# Patient Record
Sex: Male | Born: 1975 | Race: Black or African American | Hispanic: No | Marital: Single | Smoking: Current every day smoker
Health system: Southern US, Community
[De-identification: ages and names within clinical notes are randomized; demographics above are authoritative.]

## PROBLEM LIST (undated history)

## (undated) DIAGNOSIS — IMO0001 Reserved for inherently not codable concepts without codable children: Secondary | ICD-10-CM

---

## 2015-01-13 ENCOUNTER — Inpatient Hospital Stay (HOSPITAL_BASED_OUTPATIENT_CLINIC_OR_DEPARTMENT_OTHER)
Admission: EM | Admit: 2015-01-13 | Discharge: 2015-01-17 | DRG: 565 | Disposition: A | Discharge status: Home / Self Care | Payer: Self-pay | Attending: Internal Medicine | Admitting: Internal Medicine

## 2015-01-13 ENCOUNTER — Emergency Department (HOSPITAL_BASED_OUTPATIENT_CLINIC_OR_DEPARTMENT_OTHER): Payer: Self-pay

## 2015-01-13 DIAGNOSIS — F1410 Cocaine abuse, uncomplicated: Secondary | ICD-10-CM | POA: Diagnosis present

## 2015-01-13 DIAGNOSIS — F17210 Nicotine dependence, cigarettes, uncomplicated: Secondary | ICD-10-CM | POA: Diagnosis present

## 2015-01-13 DIAGNOSIS — R079 Chest pain, unspecified: Secondary | ICD-10-CM

## 2015-01-13 DIAGNOSIS — R0789 Other chest pain: Secondary | ICD-10-CM | POA: Diagnosis present

## 2015-01-13 DIAGNOSIS — F1210 Cannabis abuse, uncomplicated: Secondary | ICD-10-CM | POA: Diagnosis present

## 2015-01-13 DIAGNOSIS — L02611 Cutaneous abscess of right foot: Secondary | ICD-10-CM | POA: Diagnosis present

## 2015-01-13 DIAGNOSIS — M795 Residual foreign body in soft tissue: Principal | ICD-10-CM | POA: Diagnosis present

## 2015-01-13 DIAGNOSIS — L03115 Cellulitis of right lower limb: Secondary | ICD-10-CM

## 2015-01-13 DIAGNOSIS — B9562 Methicillin resistant Staphylococcus aureus infection as the cause of diseases classified elsewhere: Secondary | ICD-10-CM | POA: Diagnosis present

## 2015-01-13 DIAGNOSIS — F1910 Other psychoactive substance abuse, uncomplicated: Secondary | ICD-10-CM | POA: Diagnosis present

## 2015-01-13 HISTORY — DX: Reserved for inherently not codable concepts without codable children: IMO0001

## 2015-01-13 LAB — BASIC METABOLIC PANEL
ANION GAP: 7 (ref 5–15)
BUN: 15 mg/dL (ref 6–20)
CALCIUM: 8.6 mg/dL — AB (ref 8.9–10.3)
CO2: 26 mmol/L (ref 22–32)
CREATININE: 1.18 mg/dL (ref 0.61–1.24)
Chloride: 104 mmol/L (ref 101–111)
GFR calc Af Amer: >60 mL/min (ref >60)
GLUCOSE: 105 mg/dL — AB (ref 65–99)
Potassium: 3.5 mmol/L (ref 3.5–5.1)
Sodium: 137 mmol/L (ref 135–145)

## 2015-01-13 LAB — CBC WITH DIFFERENTIAL/PLATELET
BASOS ABS: 0 10*3/uL (ref 0.0–0.1)
BASOS PCT: 0 %
EOS PCT: 1 %
Eosinophils Absolute: 0.1 10*3/uL (ref 0.0–0.7)
HCT: 43.7 % (ref 39.0–52.0)
Hemoglobin: 14 g/dL (ref 13.0–17.0)
LYMPHS PCT: 21 %
Lymphs Abs: 2.1 10*3/uL (ref 0.7–4.0)
MCH: 28.8 pg (ref 26.0–34.0)
MCHC: 32 g/dL (ref 30.0–36.0)
MCV: 89.9 fL (ref 78.0–100.0)
MONO ABS: 1 10*3/uL (ref 0.1–1.0)
MONOS PCT: 10 %
Neutro Abs: 6.6 10*3/uL (ref 1.7–7.7)
Neutrophils Relative %: 68 %
PLATELETS: 165 10*3/uL (ref 150–400)
RBC: 4.86 MIL/uL (ref 4.22–5.81)
RDW: 13.4 % (ref 11.5–15.5)
WBC: 9.8 10*3/uL (ref 4.0–10.5)

## 2015-01-13 LAB — I-STAT CG4 LACTIC ACID, ED: Lactic Acid, Venous: 1.57 mmol/L (ref 0.5–2.0)

## 2015-01-13 MED ORDER — ONDANSETRON HCL 4 MG/2ML IJ SOLN
4.0000 mg | Freq: Once | INTRAMUSCULAR | Status: AC
Start: 2015-01-13 — End: 2015-01-13
  Administered 2015-01-13: 4 mg via INTRAVENOUS
  Filled 2015-01-13: qty 2

## 2015-01-13 MED ORDER — MORPHINE SULFATE (PF) 4 MG/ML IV SOLN
4.0000 mg | Freq: Once | INTRAVENOUS | Status: AC
  Administered 2015-01-13: 4 mg via INTRAVENOUS
  Filled 2015-01-13: qty 1

## 2015-01-13 MED ORDER — VANCOMYCIN HCL IN DEXTROSE 1-5 GM/200ML-% IV SOLN
1000.0000 mg | Freq: Once | INTRAVENOUS | Status: AC
  Administered 2015-01-14: 1000 mg via INTRAVENOUS
  Filled 2015-01-13: qty 200

## 2015-01-13 MED ORDER — SODIUM CHLORIDE 0.9 % IV BOLUS (SEPSIS)
1000.0000 mL | Freq: Once | INTRAVENOUS | Status: AC
  Administered 2015-01-13: 1000 mL via INTRAVENOUS

## 2015-01-13 NOTE — ED Notes (Addendum)
Patient states that he has had  "tingling" in his chest x 1 week. The patient states that he has woke up today with swelling to his right foot. Patient is rubbing his chest in pain, but is more concerned about his foot pain.

## 2015-01-13 NOTE — ED Notes (Signed)
Pt c/o cp  Sharpe tingling  X 1 week tired, sob,  Rt foot red swollen w a sore between great toe and second toe,  Onset yesterday  Denies inj

## 2015-01-13 NOTE — ED Provider Notes (Signed)
By signing my name below, I, Bethel Born, attest that this documentation has been prepared under the direction and in the presence of Marea Reasner N Ashanna Heinsohn, DO. Electronically Signed: Bethel Born, ED Scribe. 01/13/2015. 11:36 PM.  TIME SEEN: 11:24 PM  CHIEF COMPLAINT: chest pain  HPI: [name] [name] is a 39 y.o. male with no significant PMHx who presents to the Emergency Department complaining of new intermittent left-sided chest pain with onset 1 week ago. He describes the pain as like an electric shock, sharp pain with no known trigger or modifying factors. No current CP.  Associated symptoms include intermittent SOB (none at present). He also complains of 1 day of atraumatic right foot, pain, redness, and swelling with a draining area between the great and second toes. Pt denies fever, nausea,  and vomiting. Tobacco+. NKDA. Denies being a diabetic. No history of PE or DVT. No family history of CAD.  ROS: See HPI Constitutional: no fever  Eyes: no drainage  ENT: no runny nose   Cardiovascular:  chest pain  Resp: SOB  GI: no vomiting GU: no dysuria Integumentary: no rash  Allergy: no hives  Musculoskeletal: no leg swelling  Neurological: no slurred speech ROS otherwise negative  PAST MEDICAL HISTORY/PAST SURGICAL HISTORY:  No past medical history on file.  MEDICATIONS:  Prior to Admission medications   Not on File    ALLERGIES:  No Known Allergies  SOCIAL HISTORY:  Social History  Substance Use Topics  . Smoking status: Not on file  . Smokeless tobacco: Not on file  . Alcohol Use: Not on file    FAMILY HISTORY: No family history on file.  EXAM: BP 140/78 mmHg  Pulse 104  Temp(Src) 99.2 F (37.3 C) (Oral)  Resp 18  Ht  (1.702 m)  Wt 175 lb (79.379 kg)  BMI 27.40 kg/m2  SpO2 98% CONSTITUTIONAL: Alert and oriented and responds appropriately to questions. Well-appearing; well-nourished HEAD: Normocephalic EYES: Conjunctivae clear, PERRL ENT: normal nose;  no rhinorrhea; moist mucous membranes; pharynx without lesions noted NECK: Supple, no meningismus, no LAD  CARD: Regular and tachycardic; S1 and S2 appreciated; no murmurs, no clicks, no rubs, no gallops RESP: Normal chest excursion without splinting or tachypnea; breath sounds clear and equal bilaterally; no wheezes, no rhonchi, no rales, no hypoxia or respiratory distress, speaking full sentences ABD/GI: Normal bowel sounds; non-distended; soft, non-tender, no rebound, no guarding, no peritoneal signs BACK:  The back appears normal and is non-tender to palpation, there is no CVA tenderness EXT: 2+ DP pulses bilaterally normal capillary refill; no cyanosis, no calf tenderness or swelling, patient has erythema, warmth, swelling, and tenderness to the dorsal right foot and foul smelling purulent discharge bewteen the first and second toes without obvious drainable abscess, pain when moving first and second toes, normal ROM in the ankle without any obvious sign of swelling, otherwise extremity is nontender to palpation without any sign of bony deformity SKIN: Normal color for age and race; warm NEURO: Moves all extremities equally, sensation to light touch intact diffusely, cranial nerves II through XII intact PSYCH: The patient's mood and manner are appropriate. Grooming and personal hygiene are appropriate.  MEDICAL DECISION MAKING: Patient here with chest pain for the past week. EKG shows sinus tachycardia with no ischemic changes, arrhythmia. No history of PE or DVT. No history of CAD. Also complaining of a right foot cellulitis with drainage. He does have significant foul-smelling drainage on exam but no obvious abscess. We'll give IV vancomycin for his cellulitis.  Will obtain labs and cultures. We'll obtain x-ray of his foot and chest x-ray.  ED PROGRESS: Patient's labs unremarkable. Cultures pending. X-ray of his chest negative for acute abnormality. X-ray of the foot shows a 5 mm metallic needle  fragment in bed and 5 mm deep into the skin surface between the first and second metatarsals. Patient adamantly denies IV drug abuse but does admit to snorting cocaine and smoking marijuana. Discussed with patient that I am concerned that he is an IV drug abuser and because of this and his chest pain or feel he will need admission for workup for possible endocarditis. Discussed with hospitalist Dr. Toniann FailKakrakandy for admission to a telemetry bed. Will broaden his IV antibiotics and give him Zosyn as well. Patient comfortable with this plan. He does not have a primary care provider.    EKG Interpretation  Date/Time:  Wednesday January 13 2015 22:58:40 EST Ventricular Rate:  102 PR Interval:  152 QRS Duration: 72 QT Interval:  322 QTC Calculation: 419 R Axis:   85 Text Interpretation:  Sinus tachycardia Otherwise normal ECG No old for comparison Confirmed by Greyson Riccardi,  DO, Shirin Echeverry ([id number]) on 01/13/2015 11:06:47 PM       I personally performed the services described in this documentation, which was scribed in my presence. The recorded information has been reviewed and is accurate.      Layla MawKristen N Moneka Mcquinn, DO 01/14/15 [phone]

## 2015-01-14 ENCOUNTER — Encounter (HOSPITAL_COMMUNITY): Payer: Self-pay

## 2015-01-14 ENCOUNTER — Inpatient Hospital Stay (HOSPITAL_COMMUNITY): Payer: Self-pay | Admitting: Certified Registered Nurse Anesthetist

## 2015-01-14 ENCOUNTER — Encounter (HOSPITAL_COMMUNITY)
Admission: EM | Disposition: A | Discharge status: Home / Self Care | Payer: Self-pay | Source: Home / Self Care | Attending: Internal Medicine

## 2015-01-14 DIAGNOSIS — L03115 Cellulitis of right lower limb: Secondary | ICD-10-CM

## 2015-01-14 DIAGNOSIS — R079 Chest pain, unspecified: Secondary | ICD-10-CM | POA: Insufficient documentation

## 2015-01-14 HISTORY — PX: FOREIGN BODY REMOVAL: SHX962

## 2015-01-14 HISTORY — PX: I & D EXTREMITY: SHX5045

## 2015-01-14 LAB — CBC
HCT: 40.5 % (ref 39.0–52.0)
HEMOGLOBIN: 13.1 g/dL (ref 13.0–17.0)
MCH: 29.6 pg (ref 26.0–34.0)
MCHC: 32.3 g/dL (ref 30.0–36.0)
MCV: 91.4 fL (ref 78.0–100.0)
PLATELETS: 141 10*3/uL — AB (ref 150–400)
RBC: 4.43 MIL/uL (ref 4.22–5.81)
RDW: 13.6 % (ref 11.5–15.5)
WBC: 8.1 10*3/uL (ref 4.0–10.5)

## 2015-01-14 LAB — TROPONIN I: Troponin I: <0.03 ng/mL (ref <0.031)

## 2015-01-14 LAB — RAPID URINE DRUG SCREEN, HOSP PERFORMED
Amphetamines: NOT DETECTED
BENZODIAZEPINES: NOT DETECTED
Barbiturates: NOT DETECTED
Cocaine: POSITIVE — AB
Opiates: POSITIVE — AB
Tetrahydrocannabinol: POSITIVE — AB

## 2015-01-14 LAB — CREATININE, SERUM
Creatinine, Ser: 1.26 mg/dL — ABNORMAL HIGH (ref 0.61–1.24)
GFR calc Af Amer: >60 mL/min (ref >60)
GFR calc non Af Amer: >60 mL/min (ref >60)

## 2015-01-14 SURGERY — IRRIGATION AND DEBRIDEMENT EXTREMITY
Anesthesia: General | Site: Foot | Laterality: Right

## 2015-01-14 MED ORDER — SODIUM CHLORIDE 0.9 % IV SOLN
INTRAVENOUS | Status: DC
  Administered 2015-01-15: 23:00:00 via INTRAVENOUS

## 2015-01-14 MED ORDER — ACETAMINOPHEN 650 MG RE SUPP: 650.0000 mg | Freq: Four times a day (QID) | RECTAL | Status: DC | PRN

## 2015-01-14 MED ORDER — SUCCINYLCHOLINE CHLORIDE 20 MG/ML IJ SOLN
INTRAMUSCULAR | Status: DC | PRN
  Administered 2015-01-14: 100 mg via INTRAVENOUS

## 2015-01-14 MED ORDER — ONDANSETRON HCL 4 MG/2ML IJ SOLN
INTRAMUSCULAR | Status: DC | PRN
  Administered 2015-01-14: 4 mg via INTRAVENOUS

## 2015-01-14 MED ORDER — SODIUM CHLORIDE 0.9 % IR SOLN
Status: DC | PRN
  Administered 2015-01-14: 3000 mL

## 2015-01-14 MED ORDER — KETOROLAC TROMETHAMINE 30 MG/ML IJ SOLN
30.0000 mg | Freq: Once | INTRAMUSCULAR | Status: AC
Start: 2015-01-14 — End: 2015-01-14
  Administered 2015-01-14: 30 mg via INTRAVENOUS
  Filled 2015-01-14: qty 1

## 2015-01-14 MED ORDER — SODIUM CHLORIDE 0.9 % IV SOLN
INTRAVENOUS | Status: DC
  Administered 2015-01-14: 1000 mL via INTRAVENOUS

## 2015-01-14 MED ORDER — VANCOMYCIN HCL IN DEXTROSE 1-5 GM/200ML-% IV SOLN: 1000.0000 mg | Freq: Once | INTRAVENOUS | Status: DC

## 2015-01-14 MED ORDER — FENTANYL CITRATE (PF) 250 MCG/5ML IJ SOLN
INTRAMUSCULAR | Status: AC
  Filled 2015-01-14: qty 5

## 2015-01-14 MED ORDER — LIDOCAINE HCL (CARDIAC) 20 MG/ML IV SOLN
INTRAVENOUS | Status: DC | PRN
  Administered 2015-01-14: 100 mg via INTRAVENOUS

## 2015-01-14 MED ORDER — ENOXAPARIN SODIUM 40 MG/0.4ML ~~LOC~~ SOLN
40.0000 mg | SUBCUTANEOUS | Status: DC
  Administered 2015-01-15 – 2015-01-17 (×3): 40 mg via SUBCUTANEOUS
  Filled 2015-01-14 (×3): qty 0.4

## 2015-01-14 MED ORDER — BUPIVACAINE-EPINEPHRINE (PF) 0.25% -1:200000 IJ SOLN
INTRAMUSCULAR | Status: DC | PRN
  Administered 2015-01-14: 30 mL

## 2015-01-14 MED ORDER — BUPIVACAINE-EPINEPHRINE (PF) 0.5% -1:200000 IJ SOLN
INTRAMUSCULAR | Status: AC
  Filled 2015-01-14: qty 30

## 2015-01-14 MED ORDER — MIDAZOLAM HCL 2 MG/2ML IJ SOLN
INTRAMUSCULAR | Status: AC
  Filled 2015-01-14: qty 2

## 2015-01-14 MED ORDER — FENTANYL CITRATE (PF) 100 MCG/2ML IJ SOLN
INTRAMUSCULAR | Status: DC | PRN
  Administered 2015-01-14: 100 ug via INTRAVENOUS

## 2015-01-14 MED ORDER — LACTATED RINGERS IV SOLN
INTRAVENOUS | Status: DC | PRN
  Administered 2015-01-14: 13:00:00 via INTRAVENOUS

## 2015-01-14 MED ORDER — PROPOFOL 10 MG/ML IV BOLUS
INTRAVENOUS | Status: DC | PRN
  Administered 2015-01-14: 50 mg via INTRAVENOUS
  Administered 2015-01-14: 150 mg via INTRAVENOUS

## 2015-01-14 MED ORDER — VANCOMYCIN HCL IN DEXTROSE 1-5 GM/200ML-% IV SOLN
1000.0000 mg | Freq: Three times a day (TID) | INTRAVENOUS | Status: DC
Start: 2015-01-14 — End: 2015-01-17
  Administered 2015-01-14 – 2015-01-17 (×10): 1000 mg via INTRAVENOUS
  Filled 2015-01-14 (×12): qty 200

## 2015-01-14 MED ORDER — ENOXAPARIN SODIUM 40 MG/0.4ML ~~LOC~~ SOLN: 40.0000 mg | SUBCUTANEOUS | Status: DC

## 2015-01-14 MED ORDER — SODIUM CHLORIDE 0.9 % IV SOLN
INTRAVENOUS | Status: DC
  Administered 2015-01-14: 14:00:00 via INTRAVENOUS

## 2015-01-14 MED ORDER — DOCUSATE SODIUM 100 MG PO CAPS
100.0000 mg | ORAL_CAPSULE | Freq: Two times a day (BID) | ORAL | Status: DC
  Administered 2015-01-14 – 2015-01-17 (×4): 100 mg via ORAL
  Filled 2015-01-14 (×6): qty 1

## 2015-01-14 MED ORDER — PIPERACILLIN-TAZOBACTAM 3.375 G IVPB
3.3750 g | Freq: Three times a day (TID) | INTRAVENOUS | Status: DC
  Administered 2015-01-14 – 2015-01-17 (×9): 3.375 g via INTRAVENOUS
  Filled 2015-01-14 (×10): qty 50

## 2015-01-14 MED ORDER — ACETAMINOPHEN 325 MG PO TABS
650.0000 mg | ORAL_TABLET | Freq: Four times a day (QID) | ORAL | Status: DC | PRN
  Administered 2015-01-14 – 2015-01-15 (×3): 650 mg via ORAL
  Filled 2015-01-14 (×3): qty 2

## 2015-01-14 MED ORDER — PROPOFOL 10 MG/ML IV BOLUS
INTRAVENOUS | Status: AC
  Filled 2015-01-14: qty 20

## 2015-01-14 MED ORDER — SODIUM CHLORIDE 0.9 % IV SOLN: INTRAVENOUS | Status: DC

## 2015-01-14 MED ORDER — FENTANYL CITRATE (PF) 250 MCG/5ML IJ SOLN
INTRAMUSCULAR | Status: DC | PRN
  Administered 2015-01-14: 150 ug via INTRAVENOUS
  Administered 2015-01-14 (×2): 100 ug via INTRAVENOUS

## 2015-01-14 MED ORDER — ONDANSETRON HCL 4 MG/2ML IJ SOLN: 4.0000 mg | Freq: Four times a day (QID) | INTRAMUSCULAR | Status: DC | PRN

## 2015-01-14 MED ORDER — OXYCODONE HCL 5 MG PO TABS: 5.0000 mg | ORAL_TABLET | ORAL | Status: DC | PRN

## 2015-01-14 MED ORDER — ONDANSETRON HCL 4 MG PO TABS: 4.0000 mg | ORAL_TABLET | Freq: Four times a day (QID) | ORAL | Status: DC | PRN

## 2015-01-14 MED ORDER — OXYCODONE HCL 5 MG PO TABS
5.0000 mg | ORAL_TABLET | ORAL | Status: DC | PRN
  Administered 2015-01-14 (×2): 5 mg via ORAL
  Administered 2015-01-15 – 2015-01-16 (×2): 10 mg via ORAL
  Filled 2015-01-14: qty 1
  Filled 2015-01-14 (×2): qty 2
  Filled 2015-01-14: qty 1

## 2015-01-14 MED ORDER — 0.9 % SODIUM CHLORIDE (POUR BTL) OPTIME
TOPICAL | Status: DC | PRN
  Administered 2015-01-14: 1000 mL

## 2015-01-14 MED ORDER — FENTANYL CITRATE (PF) 100 MCG/2ML IJ SOLN: 25.0000 ug | INTRAMUSCULAR | Status: DC | PRN

## 2015-01-14 MED ORDER — SENNA 8.6 MG PO TABS
1.0000 | ORAL_TABLET | Freq: Two times a day (BID) | ORAL | Status: DC
  Administered 2015-01-14 – 2015-01-17 (×4): 8.6 mg via ORAL
  Filled 2015-01-14 (×6): qty 1

## 2015-01-14 MED ORDER — PROMETHAZINE HCL 25 MG/ML IJ SOLN: 6.2500 mg | INTRAMUSCULAR | Status: DC | PRN

## 2015-01-14 MED ORDER — PIPERACILLIN-TAZOBACTAM 3.375 G IVPB 30 MIN
3.3750 g | Freq: Once | INTRAVENOUS | Status: AC
  Administered 2015-01-14: 3.375 g via INTRAVENOUS
  Filled 2015-01-14 (×2): qty 50

## 2015-01-14 SURGICAL SUPPLY — 65 items
BANDAGE ELASTIC 4 VELCRO ST LF (GAUZE/BANDAGES/DRESSINGS) ×3 IMPLANT
BANDAGE ESMARK 6X9 LF (GAUZE/BANDAGES/DRESSINGS) ×1 IMPLANT
BLADE SURG 10 STRL SS (BLADE) ×3 IMPLANT
BNDG COHESIVE 4X5 TAN STRL (GAUZE/BANDAGES/DRESSINGS) ×3 IMPLANT
BNDG COHESIVE 6X5 TAN STRL LF (GAUZE/BANDAGES/DRESSINGS) ×3 IMPLANT
BNDG CONFORM 3 STRL LF (GAUZE/BANDAGES/DRESSINGS) ×3 IMPLANT
BNDG ESMARK 6X9 LF (GAUZE/BANDAGES/DRESSINGS) ×3
BNDG GAUZE ELAST 4 BULKY (GAUZE/BANDAGES/DRESSINGS) ×3 IMPLANT
CANISTER SUCT 3000ML PPV (MISCELLANEOUS) ×3 IMPLANT
CHLORAPREP W/TINT 26ML (MISCELLANEOUS) ×3 IMPLANT
CONT SPEC 4OZ CLIKSEAL STRL BL (MISCELLANEOUS) ×3 IMPLANT
COVER SURGICAL LIGHT HANDLE (MISCELLANEOUS) ×3 IMPLANT
CUFF TOURNIQUET SINGLE 34IN LL (TOURNIQUET CUFF) ×3 IMPLANT
CUFF TOURNIQUET SINGLE 44IN (TOURNIQUET CUFF) IMPLANT
DRAIN CHANNEL 10M FLAT 3/4 FLT (DRAIN) IMPLANT
DRAIN PENROSE 1/2X12 LTX STRL (WOUND CARE) ×3 IMPLANT
DRAPE OEC MINIVIEW 54X84 (DRAPES) ×3 IMPLANT
DRAPE U-SHAPE 47X51 STRL (DRAPES) ×3 IMPLANT
DRSG ADAPTIC 3X8 NADH LF (GAUZE/BANDAGES/DRESSINGS) ×3 IMPLANT
DRSG PAD ABDOMINAL 8X10 ST (GAUZE/BANDAGES/DRESSINGS) IMPLANT
ELECT REM PT RETURN 9FT ADLT (ELECTROSURGICAL) ×3
ELECTRODE REM PT RTRN 9FT ADLT (ELECTROSURGICAL) ×1 IMPLANT
EVACUATOR SILICONE 100CC (DRAIN) IMPLANT
GAUZE SPONGE 4X4 12PLY STRL (GAUZE/BANDAGES/DRESSINGS) IMPLANT
GLOVE BIO SURGEON STRL SZ8 (GLOVE) ×3 IMPLANT
GLOVE BIOGEL PI IND STRL 6.5 (GLOVE) ×1 IMPLANT
GLOVE BIOGEL PI IND STRL 7.5 (GLOVE) ×1 IMPLANT
GLOVE BIOGEL PI IND STRL 8 (GLOVE) ×2 IMPLANT
GLOVE BIOGEL PI INDICATOR 6.5 (GLOVE) ×2
GLOVE BIOGEL PI INDICATOR 7.5 (GLOVE) ×2
GLOVE BIOGEL PI INDICATOR 8 (GLOVE) ×4
GLOVE ECLIPSE 7.5 STRL STRAW (GLOVE) ×3 IMPLANT
GOWN STRL REUS W/ TWL XL LVL3 (GOWN DISPOSABLE) ×2 IMPLANT
GOWN STRL REUS W/TWL XL LVL3 (GOWN DISPOSABLE) ×4
KIT BASIN OR (CUSTOM PROCEDURE TRAY) ×3 IMPLANT
KIT ROOM TURNOVER OR (KITS) ×3 IMPLANT
NEEDLE HYPO 25GX1X1/2 BEV (NEEDLE) ×3 IMPLANT
NS IRRIG 1000ML POUR BTL (IV SOLUTION) ×3 IMPLANT
PACK ORTHO EXTREMITY (CUSTOM PROCEDURE TRAY) ×3 IMPLANT
PAD ABD 8X10 STRL (GAUZE/BANDAGES/DRESSINGS) ×3 IMPLANT
PAD ARMBOARD 7.5X6 YLW CONV (MISCELLANEOUS) ×6 IMPLANT
PAD CAST 4YDX4 CTTN HI CHSV (CAST SUPPLIES) ×1 IMPLANT
PADDING CAST COTTON 4X4 STRL (CAST SUPPLIES) ×2
SET CYSTO W/LG BORE CLAMP LF (SET/KITS/TRAYS/PACK) ×3 IMPLANT
SOAP 2 % CHG 4 OZ (WOUND CARE) ×3 IMPLANT
SPONGE GAUZE 4X4 12PLY STER LF (GAUZE/BANDAGES/DRESSINGS) ×3 IMPLANT
SPONGE LAP 4X18 X RAY DECT (DISPOSABLE) ×3 IMPLANT
STAPLER VISISTAT 35W (STAPLE) IMPLANT
SUCTION FRAZIER TIP 10 FR DISP (SUCTIONS) ×3 IMPLANT
SUT ETHILON 2 0 FS 18 (SUTURE) ×3 IMPLANT
SUT ETHILON 3 0 FSL (SUTURE) IMPLANT
SUT PROLENE 3 0 PS 2 (SUTURE) ×3 IMPLANT
SUT VIC AB 2-0 CT1 27 (SUTURE) ×4
SUT VIC AB 2-0 CT1 TAPERPNT 27 (SUTURE) ×2 IMPLANT
SUT VIC AB 3-0 PS2 18 (SUTURE) ×2
SUT VIC AB 3-0 PS2 18XBRD (SUTURE) ×1 IMPLANT
SWAB COLLECTION DEVICE MRSA (MISCELLANEOUS) ×3 IMPLANT
SYR CONTROL 10ML LL (SYRINGE) ×3 IMPLANT
TOWEL OR 17X24 6PK STRL BLUE (TOWEL DISPOSABLE) ×3 IMPLANT
TOWEL OR 17X26 10 PK STRL BLUE (TOWEL DISPOSABLE) ×3 IMPLANT
TUBE CONNECTING 12'X1/4 (SUCTIONS) ×1
TUBE CONNECTING 12X1/4 (SUCTIONS) ×2 IMPLANT
TUBING CYSTO DISP (UROLOGICAL SUPPLIES) ×3 IMPLANT
WATER STERILE IRR 1000ML POUR (IV SOLUTION) ×3 IMPLANT
YANKAUER SUCT BULB TIP NO VENT (SUCTIONS) ×3 IMPLANT

## 2015-01-14 NOTE — Progress Notes (Signed)
Patient arrived via [facility].  In no distress or pain at this time. Oriented to room, physician has been paged, call light within reach.

## 2015-01-14 NOTE — Progress Notes (Signed)
Patient returned from PACU. Vitals taken and were stable, neuro check done and were normal. Right foot is elevated and patient is resting. Will continue to monitor.

## 2015-01-14 NOTE — Op Note (Signed)
NAMMonika [name]:  [name], [name]                ACCOUNT NO.:  [id number]  MEDICAL RECORD NO.:  [id number]  LOCATION:  [id number]                        FACILITY:  [facility]  PHYSICIAN:  [name] ArthursJohn Quinnlan Abruzzo, MD        DATE OF BIRTH:  23-Apr-1975  DATE OF PROCEDURE:  01/14/2015 DATE OF DISCHARGE:                              OPERATIVE REPORT   PREOPERATIVE DIAGNOSES: 1. Right foot deep abscess. 2. Right foot retained foreign body.  POSTOPERATIVE DIAGNOSES: 1. Right foot deep abscess. 2. Right foot retained foreign body.  PROCEDURE: 1. Irrigation and debridement of right foot abscess including multiple     areas. 2. Removal of deep foreign body from the right foot. 3. Two view radiographs of the right foot.  SURGEON:  [name] ArthursJohn Leeman Johnsey, MD.  ANESTHESIA:  General.  ESTIMATED BLOOD LOSS:  Minimal.  TOURNIQUET TIME:  15 minutes with an ankle Esmarch.  COMPLICATIONS:  None apparent.  DISPOSITION:  Extubated, awake, and stable to recovery.  INDICATIONS FOR PROCEDURE:  The patient is a 39 year old male with past medical history significant for cigarette smoking.  He was admitted to the Internal Medicine Service early this morning with right foot cellulitis.  Radiograph reveals a small radio-opaque foreign body of the first webspace dorsally at the foot.  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  He presents now for operative treatment of this abscess as well as attempted removal of the foreign body.  He understands the risks and benefits, the alternative treatment options, and elects surgical treatment.  He specifically understands risks of bleeding, infection, nerve damage, blood clots, need for additional surgery, continued pain, amputation, and death.  PROCEDURE IN DETAIL:  After preoperative consent was obtained and the correct operative site was identified, the patient was brought to the operating room and placed supine on the  operating table.  General anesthesia was induced.  The patient was already on therapeutic IV vancomycin and Zosyn.  Surgical time-out was taken.  The right lower extremity was prepped and draped in standard sterile fashion.  Foot was exsanguinated and a 4-inch Esmarch tourniquet was wrapped around the ankle.  A longitudinal incision was made over the dorsum of the first webspace.  Sharp dissection was carried down through the skin and subcutaneous tissue.  Immediately evident was an abscess filled with pus.  A specimen of this purulent material was collected and sent as a tissue to Microbiology for aerobic and anaerobic culture.  The deep subcutaneous tissues were then spread bluntly with a hemostat.  The abscess appeared to be a collar-button abscess involving the plantar spaces deep to the fascia and then the superficial subcutaneous tissues. All these areas were broken up with the hemostat.  A curette, rongeur, and elevator were used to remove all purulent material as well as nonviable looking soft tissues.  The wound was then irrigated copiously with 3 L of normal saline.  At this point, an AP radiograph was obtained and the small radio-opaque foreign body was identified.  A rongeur was used to grasp this foreign body and it was removed.  Another AP radiograph confirmed complete removal of  this radio-opaque foreign body. The soft tissues were inspected on the back table on a small shard of what appeared to be metal was identified within the excised soft tissues.  The wound was then irrigated copiously.  A half-inch Penrose drain was placed in the deep portion of the abscess.  Simple sutures of 2-0 nylon were used to loosely approximate the soft tissues around the drain.  Sterile dressings were applied.  The tourniquet was released at 15 minutes during the irrigation.  Hemostasis was achieved prior to closure.  Sterile dressings were applied followed by compression wrap and an Ace  bandage.  The patient was awakened from anesthesia and transported to the recovery room in stable condition.  FOLLOWUP PLAN:  The patient will continue on IV vancomycin and Zosyn pending the culture results.  He will be weightbearing as tolerated on that foot in a flat postop shoe.  He will have the Penrose drain removed in a day or two.  RADIOGRAPHS:  AP radiographs of the foot pre and post foreign body removal were performed today intraoperatively.  The first film showed the foreign body at the tip of a needle.  The second film showed no evidence of any radiopaque foreign body.  No other acute injuries were noted.     [name] Arthurs, MD     JH/MEDQ  D:  01/14/2015  T:  01/14/2015  Job:  [id number]

## 2015-01-14 NOTE — Progress Notes (Signed)
Orthopedic Tech Progress Note Patient Details:  Mauri ReadingJames Vogan 1975/07/19 [id number]  Ortho Devices Type of Ortho Device: Postop shoe/boot Ortho Device/Splint Interventions: Application   Saul FordyceJennifer C Ellissa Ayo 01/14/2015, 4:24 PM

## 2015-01-14 NOTE — Anesthesia Postprocedure Evaluation (Signed)
Anesthesia Post Note  Patient: [name] [name]  Procedure(s) Performed: Procedure(s) (LRB): IRRIGATION AND DEBRIDEMENT EXTREMITY (Right) REMOVAL FOREIGN BODY EXTREMITY (Right)  Patient location during evaluation: PACU Anesthesia Type: General Level of consciousness: awake and alert and oriented Pain management: pain level controlled Vital Signs Assessment: post-procedure vital signs reviewed and stable Respiratory status: spontaneous breathing, nonlabored ventilation, respiratory function stable and patient connected to nasal cannula oxygen Cardiovascular status: blood pressure returned to baseline and stable Postop Assessment: No signs of nausea or vomiting Anesthetic complications: no    Last Vitals:  Filed Vitals:   01/14/15 1329 01/14/15 1333  BP:    Pulse:    Temp: 36.8 C   Resp: 15 15    Last Pain:  Filed Vitals:   01/14/15 1335  PainSc: Asleep        RLE Motor Response: Purposeful movement, Responds to commands RLE Sensation: Full sensation      Cecile HearingStephen Edward Turk

## 2015-01-14 NOTE — Discharge Instructions (Addendum)
Change your dressing daily with dry gauze and an ACE bandage.  You may walk on your foot in the hard shoe.  You may take the dressing off to shower, but don't soak your foot in the bath tub. Call Dr.Hewitts office with any questions [phone].

## 2015-01-14 NOTE — Plan of Care (Signed)
Was called for admission by Dr. Elesa MassedWard. [age] year old male presents with chest pain and lower extremity cellulitis. X-ray show foreign body in the lower extremity. Has a draining pus from the foot. Blood cultures were obtained and patient was placed on broad-spectrum antibiotics. Patient accepted for admission for chest pain and cellulitis of the lower extremity. Concern for bacteremia/endocarditis per ER physician.  [name] [name]

## 2015-01-14 NOTE — Anesthesia Preprocedure Evaluation (Addendum)
Anesthesia Evaluation  Patient identified by MRN, date of birth, ID band Patient awake    Reviewed: Allergy & Precautions, NPO status , Patient's Chart, lab work & pertinent test results  History of Anesthesia Complications Negative for: history of anesthetic complications  Airway Mallampati: I  TM Distance: >3 FB Neck ROM: Full    Dental  (+) Teeth Intact, Dental Advisory Given   Pulmonary Current Smoker (1 PPD x25 years),    Pulmonary exam normal breath sounds clear to auscultation       Cardiovascular Exercise Tolerance: Good (-) hypertension(-) CAD, (-) Past MI and (-) CHF negative cardio ROS Normal cardiovascular exam Rhythm:Regular Rate:Normal     Neuro/Psych negative neurological ROS  negative psych ROS   GI/Hepatic negative GI ROS, Neg liver ROS,   Endo/Other  negative endocrine ROS  Renal/GU negative Renal ROS  negative genitourinary   Musculoskeletal negative musculoskeletal ROS (+)   Abdominal   Peds  Hematology negative hematology ROS (+)   Anesthesia Other Findings Day of surgery medications reviewed with the patient.  Reproductive/Obstetrics                            Anesthesia Physical Anesthesia Plan  ASA: II  Anesthesia Plan: General   Post-op Pain Management:    Induction: Intravenous  Airway Management Planned: Oral ETT  Additional Equipment:   Intra-op Plan:   Post-operative Plan: Extubation in OR  Informed Consent: I have reviewed the patients History and Physical, chart, labs and discussed the procedure including the risks, benefits and alternatives for the proposed anesthesia with the patient or authorized representative who has indicated his/her understanding and acceptance.   Dental advisory given  Plan Discussed with: CRNA  Anesthesia Plan Comments: (Risks/benefits of general anesthesia discussed with patient including risk of damage to teeth,  lips, gum, and tongue, nausea/vomiting, allergic reactions to medications, and the possibility of heart attack, stroke and death.  All patient questions answered.  Patient wishes to proceed.)       Anesthesia Quick Evaluation

## 2015-01-14 NOTE — Progress Notes (Signed)
ANTIBIOTIC CONSULT NOTE - INITIAL  Pharmacy Consult for Zosyn  Indication: Cellulitis  No Known Allergies  Patient Measurements: Height: 5\' 10"  (177.8 cm) Weight: 175 lb (79.379 kg) IBW/kg (Calculated) : 73   Labs:  Recent Labs  01/13/15 2330 01/14/15 0613  WBC 9.8 8.1  HGB 14.0 13.1  PLT 165 141*  CREATININE 1.18 1.26*   Estimated Creatinine Clearance: 82.1 mL/min (by C-G formula based on Cr of 1.26).  Medical History: Past Medical History  Diagnosis Date  . Shortness of breath dyspnea     Assessment: 39 y/o with right foot abscess/ cellulitis,  S/p I&D right foot deep abscess/ removal of deep foreing body today11/24-  broad-spectrum IV antibiotics with Vancomycin, now to add Zosyn. afebrile, tm 99.2 WBC wnl renal function good, SCR 1.18>1.26, CRCL 80 ml/min   Goal of Therapy:  Vancomycin trough level 10-15 mcg/ml  Plan:  Zosyn 3.375 gm IV q8h (infuse each dose over extended 4 hours) -Continue Vancomycin 1000 mg IV q8h Vanc trough levels at steady state F/u culture results, renal function.  Noah Delaineuth Hanson Medeiros, RPh Clinical Pharmacist Pager: [phone] 01/14/2015,2:40 PM

## 2015-01-14 NOTE — Brief Op Note (Signed)
01/13/2015 - 01/14/2015  1:17 PM  PATIENT:  [name] [name]  39 y.o. male  PRE-OPERATIVE DIAGNOSIS: 1.  Right foot abscess      2.  Right foot foreign body  POST-OPERATIVE DIAGNOSIS:  Same  Procedure(s): 1.  Irrigation and debridement of right foot abscess including multiple areas   2.  Removal of foreign body from right foot   3.  2 view xrays of the right foot  SURGEON:  Toni ArthursJohn Jolene Guyett, MD  ASSISTANT: n/a  ANESTHESIA:   General  EBL:  minimal   TOURNIQUET:  15 min with ankle esmarch  COMPLICATIONS:  None apparent  DISPOSITION:  Extubated, awake and stable to recovery.  DICTATION ID:  [id number]

## 2015-01-14 NOTE — Consult Note (Signed)
Reason for Consult:  Right foot pain Referring Physician: Dr. Rolin Barry Bonny is an 39 y.o. male.  HPI: 39 y/o male with PMH of cigarette smoking c/o pain in his right foot for the last several days that worsened significantly overnight.  He c/o pus draining from the foot between his 1st and 2nd toes.  He denies f/c/n/v.  He denies IV drug use.  He has no h/o diabetes.  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  On vanc since admission.  Past Medical History  Diagnosis Date  . Shortness of breath dyspnea     History reviewed. No pertinent past surgical history.  History reviewed. No pertinent family history.  Social History:  reports that he has been smoking Cigarettes.  He has a 37.5 pack-year smoking history. He has never used smokeless tobacco. He reports that he drinks about 1.8 oz of alcohol per week. He reports that he uses illicit drugs (Marijuana and Cocaine).  Allergies: No Known Allergies  Medications: I have reviewed the patient's current medications.  Results for orders placed or performed during the hospital encounter of 01/13/15 (from the past 48 hour(s))  CBC with Differential     Status: None   Collection Time: 01/13/15 11:30 PM  Result Value Ref Range   WBC 9.8 4.0 - 10.5 K/uL   RBC 4.86 4.22 - 5.81 MIL/uL   Hemoglobin 14.0 13.0 - 17.0 g/dL   HCT 43.7 39.0 - 52.0 %   MCV 89.9 78.0 - 100.0 fL   MCH 28.8 26.0 - 34.0 pg   MCHC 32.0 30.0 - 36.0 g/dL   RDW 13.4 11.5 - 15.5 %   Platelets 165 150 - 400 K/uL   Neutrophils Relative % 68 %   Neutro Abs 6.6 1.7 - 7.7 K/uL   Lymphocytes Relative 21 %   Lymphs Abs 2.1 0.7 - 4.0 K/uL   Monocytes Relative 10 %   Monocytes Absolute 1.0 0.1 - 1.0 K/uL   Eosinophils Relative 1 %   Eosinophils Absolute 0.1 0.0 - 0.7 K/uL   Basophils Relative 0 %   Basophils Absolute 0.0 0.0 - 0.1 K/uL  Basic metabolic panel     Status: Abnormal    Collection Time: 01/13/15 11:30 PM  Result Value Ref Range   Sodium 137 135 - 145 mmol/L   Potassium 3.5 3.5 - 5.1 mmol/L   Chloride 104 101 - 111 mmol/L   CO2 26 22 - 32 mmol/L   Glucose, Bld 105 (H) 65 - 99 mg/dL   BUN 15 6 - 20 mg/dL   Creatinine, Ser 1.18 0.61 - 1.24 mg/dL   Calcium 8.6 (L) 8.9 - 10.3 mg/dL   GFR calc non Af Amer >60 >60 mL/min   GFR calc Af Amer >60 >60 mL/min    Comment: (NOTE) The eGFR has been calculated using the CKD EPI equation. This calculation has not been validated in all clinical situations. eGFR's persistently <60 mL/min signify possible Chronic Kidney Disease.    Anion gap 7 5 - 15  Troponin I     Status: None   Collection Time: 01/13/15 11:30 PM  Result Value Ref Range   Troponin I <0.03 <0.031 ng/mL    Comment:        NO INDICATION OF MYOCARDIAL INJURY.   I-Stat CG4 Lactic Acid, ED     Status: None   Collection Time: 01/13/15  11:37 PM  Result Value Ref Range   Lactic Acid, Venous 1.57 0.5 - 2.0 mmol/L  Urine rapid drug screen (hosp performed)     Status: Abnormal   Collection Time: 01/14/15  4:10 AM  Result Value Ref Range   Opiates POSITIVE (A) NONE DETECTED   Cocaine POSITIVE (A) NONE DETECTED   Benzodiazepines NONE DETECTED NONE DETECTED   Amphetamines NONE DETECTED NONE DETECTED   Tetrahydrocannabinol POSITIVE (A) NONE DETECTED   Barbiturates NONE DETECTED NONE DETECTED    Comment:        DRUG SCREEN FOR MEDICAL PURPOSES ONLY.  IF CONFIRMATION IS NEEDED FOR ANY PURPOSE, NOTIFY LAB WITHIN 5 DAYS.        LOWEST DETECTABLE LIMITS FOR URINE DRUG SCREEN Drug Class       Cutoff (ng/mL) Amphetamine      1000 Barbiturate      200 Benzodiazepine   161 Tricyclics       096 Opiates          300 Cocaine          300 THC              50   CBC     Status: Abnormal   Collection Time: 01/14/15  6:13 AM  Result Value Ref Range   WBC 8.1 4.0 - 10.5 K/uL   RBC 4.43 4.22 - 5.81 MIL/uL   Hemoglobin 13.1 13.0 - 17.0 g/dL   HCT 40.5 39.0  - 52.0 %   MCV 91.4 78.0 - 100.0 fL   MCH 29.6 26.0 - 34.0 pg   MCHC 32.3 30.0 - 36.0 g/dL   RDW 13.6 11.5 - 15.5 %   Platelets 141 (L) 150 - 400 K/uL  Creatinine, serum     Status: Abnormal   Collection Time: 01/14/15  6:13 AM  Result Value Ref Range   Creatinine, Ser 1.26 (H) 0.61 - 1.24 mg/dL   GFR calc non Af Amer >60 >60 mL/min   GFR calc Af Amer >60 >60 mL/min    Comment: (NOTE) The eGFR has been calculated using the CKD EPI equation. This calculation has not been validated in all clinical situations. eGFR's persistently <60 mL/min signify possible Chronic Kidney Disease.     Dg Chest 2 View  01/14/2015  CLINICAL DATA:  Chest pain for 1 week. Sudden onset right foot swelling. No injury. EXAM: CHEST  2 VIEW COMPARISON:  None. FINDINGS: The heart size and mediastinal contours are within normal limits. Both lungs are clear. The visualized skeletal structures are unremarkable. IMPRESSION: No active cardiopulmonary disease. Electronically Signed   By: Lucienne Capers M.D.   On: 01/14/2015 00:36   Dg Foot Complete Right  01/14/2015  CLINICAL DATA:  Acute onset of right foot swelling and oozing between the toes. Initial encounter. EXAM: RIGHT FOOT COMPLETE - 3+ VIEW COMPARISON:  None. FINDINGS: There is no evidence of fracture or dislocation. The joint spaces are preserved. There is no evidence of talar subluxation; the subtalar joint is unremarkable in appearance. Dorsal soft tissue swelling is noted at the forefoot, with suggestion of a 5 mm metallic needle fragment embedded 5 mm deep to the skin surface at the dorsal soft tissues, projecting between the first and second metatarsals. IMPRESSION: 1. No evidence of fracture or dislocation. 2. Dorsal soft tissue swelling, with suggestion of a 5 mm metallic needle fragment embedded 5 mm deep to the skin surface at the dorsal soft tissues, projecting between the first and second metatarsals.  Electronically Signed   By: Garald Balding M.D.    On: 01/16/2015 00:36    ROS:  Pain and sweling in right foot as above.  No f/c/n/v/wt loss. PE:  Blood pressure 107/65, pulse 84, temperature 97.9 F (36.6 C), temperature source Oral, resp. rate 20, height 5' 10"  (1.778 m), weight 79.379 kg (175 lb), SpO2 95 %. wn wd male in nad.  A and O x 4.  Mood and affect normal.  EOMI.  resp unlabored.  R foot swollen dorsally.  No lympahdenoapthy.  Brisk cap refill at the toes.  sens to LT intact dorsally.  5/5 strength in PF and DF of the ankle and toes.  NO crepitus.  Assessment/Plan: R foot foreign body and abscess - I explained the nature of the condition tot he patient in detail.  I believe this requires I and D and removal of the foreign body.  Unfortunatly the pt had breakfast at 0700.  We'll plan for OR this afternoon.  He understands the plan and agrees.  The risks and benefits of the alternative treatment options have been discussed in detail.  The patient wishes to proceed with surgery and specifically understands risks of bleeding, infection, nerve damage, blood clots, need for additional surgery, amputation and death.   Wylene Simmer 01-16-15, 9:28 AM

## 2015-01-14 NOTE — ED Notes (Signed)
Pt on heart monitor already placed when I entered RM

## 2015-01-14 NOTE — H&P (Signed)
History and Physical  Patient Name: [name] [name]     ZOX:[id number]    DOB: 16-Aug-1975    DOA: 01/13/2015 Referring physician: Rochele Raring, MD PCP: No PCP Per Patient      Chief Complaint: Foot pain  HPI: [name] [name] is a 39 y.o. male with no significant past medical history who presents with right foot swelling and pain.  The patient was in his usual state of health until about one week ago when he developed some intermittent chills. Then yesterday the patient woke up with swelling and pain in his right foot along with redness. During the day he pressed on the foot and pus came out from between his toes so he went to the ER.  In the ED, the patient had a low-grade fever and mild tachycardia. His troponin was negative and a urine drug screen showed cocaine, opiates, and THC. Lactic acid level was normal, and a foot x-ray showed what appeared to be radiopaque foreign body in between the first and second metatarsals. 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     Review of Systems:  Pt complains of chills, tired, right foot pain, swelling and redness with purulent drainage.  He also notes intermittent vague left armpit/chest "tingling" and SOB over the last week Pt denies any fever, IVDU, skin lesions, rash, chest discomfort.  All other systems negative except as just noted or noted in the history of present illness.   Allergies: No Known Allergies  Home medications: Took one Vicodin yesterday for foot pain  Past medical history: None  Past surgical history: Never  Family history:  Mother and father were healthy, he reports.   Social History:  Patient lives with friends and on couches.  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  He smokes, uses THC, and uses cocaine, but does not use any injectable  drugs now or in the past.        Physical Exam: BP 109/70 mmHg  Pulse 83  Temp(Src) 98.6 F (37 C) (Oral)  Resp 20  Ht  (1.702 m)  Wt 79.379 kg (175 lb)  BMI 27.40 kg/m2  SpO2 98% General appearance: Well-developed, adult male, alert and in no acute distress.   Eyes: Anicteric, conjunctiva pink, lids and lashes normal.     ENT: No nasal deformity, discharge, or epistaxis.  OP moist without lesions.   Lymph: No cervical, supraclavicular or axillary lymphadenopathy. Skin: Warm and dry.  No suspicious rashes or lesions on the arms, hands, palms.  On the right forefoot, there is redness, swelling and pain.  No purulent drainage is seen at this time.   Cardiac: RRR, nl S1-S2, no murmurs appreciated.   Respiratory: Normal respiratory rate and rhythm.  CTAB without rales or wheezes. Abdomen: Abdomen soft without rigidity.  Minimal TTP, voluntary guarding. No ascites, distension.   MSK: No deformities or effusions. Neuro: Sensorium intact and responding to questions, attention normal.  Speech is fluent.  Moves all extremities equally and with normal coordination.    Psych: Behavior appropriate.  Affect normal.  No evidence of aural or visual hallucinations or delusions.       Labs on Admission:  The metabolic panel shows normal sodium, potassium, bicarbonate, and renal function. The troponin is negative. The urine drug screen is positive for cocaine,  opiates, and THC. The lactic acid level is normal. The complete blood count shows no leukocytosis, anemia, thrombocytopenia.   Radiological Exams on Admission: Personally reviewed: Dg Chest 2 View  01/14/2015  CLINICAL DATA:  Chest pain for 1 week. Sudden onset right foot swelling. No injury. EXAM: CHEST  2 VIEW COMPARISON:  None. FINDINGS: The heart size and mediastinal contours are within normal limits. Both lungs are clear. The visualized skeletal structures are unremarkable. IMPRESSION: No active cardiopulmonary disease.  Electronically Signed   By: Burman NievesWilliam  Stevens M.D.   On: 01/14/2015 00:36   Dg Foot Complete Right  01/14/2015  CLINICAL DATA:  Acute onset of right foot swelling and oozing between the toes. Initial encounter. EXAM: RIGHT FOOT COMPLETE - 3+ VIEW COMPARISON:  None. FINDINGS: There is no evidence of fracture or dislocation. The joint spaces are preserved. There is no evidence of talar subluxation; the subtalar joint is unremarkable in appearance. Dorsal soft tissue swelling is noted at the forefoot, with suggestion of a 5 mm metallic needle fragment embedded 5 mm deep to the skin surface at the dorsal soft tissues, projecting between the first and second metatarsals. IMPRESSION: 1. No evidence of fracture or dislocation. 2. Dorsal soft tissue swelling, with suggestion of a 5 mm metallic needle fragment embedded 5 mm deep to the skin surface at the dorsal soft tissues, projecting between the first and second metatarsals. Electronically Signed   By: Roanna RaiderJeffery  Chang M.D.   On: 01/14/2015 00:36        Assessment/Plan 1. Cellulitis of the right foot:  This is new.  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 EDP had some concern about IV drug use and endocarditis, but the patient denies, and has a better alternative explanation for his cellulitis. -Vancomycin IV -Consult to Podiatry, whether foreign body can be removed -Oxycodone for pain      DVT PPx: Lovenox Diet: Regular Consultants: Podiatry Code Status: Full   Medical decision making: What exists of the patient's previous chart was reviewed in depth. Patient seen 5:43 AM on 01/14/2015.  Disposition Plan:  Admit for IV antibiotics and surgical consultation for the possibility/appropriateness of foreign body removal.      Alberteen Samhristopher P Garreth Burnsworth [facility] Pager [phone]

## 2015-01-14 NOTE — Transfer of Care (Signed)
Immediate Anesthesia Transfer of Care Note  Patient: [name] [name]  Procedure(s) Performed: Procedure(s): IRRIGATION AND DEBRIDEMENT EXTREMITY (Right) REMOVAL FOREIGN BODY EXTREMITY (Right)  Patient Location: PACU  Anesthesia Type:General  Level of Consciousness: awake, alert , oriented and patient cooperative  Airway & Oxygen Therapy: Patient Spontanous Breathing and Patient connected to nasal cannula oxygen  Post-op Assessment: Report given to RN, Post -op Vital signs reviewed and stable and Patient moving all extremities  Post vital signs: Reviewed and stable  Last Vitals:  Filed Vitals:   01/14/15 0320 01/14/15 0624  BP: 109/70 107/65  Pulse: 83 84  Temp: 37 C 36.6 C  Resp: 20 20    Complications: No apparent anesthesia complications

## 2015-01-14 NOTE — Anesthesia Procedure Notes (Signed)
Procedure Name: Intubation Date/Time: 01/14/2015 12:36 PM Performed by: Adonis HousekeeperNGELL, Salathiel Ferrara M Pre-anesthesia Checklist: Patient identified, Suction available, Patient being monitored and Emergency Drugs available Patient Re-evaluated:Patient Re-evaluated prior to inductionOxygen Delivery Method: Circle system utilized Preoxygenation: Pre-oxygenation with 100% oxygen Intubation Type: IV induction and Rapid sequence Laryngoscope Size: Mac and 4 Grade View: Grade II Tube type: Oral Tube size: 7.5 mm Number of attempts: 1 Airway Equipment and Method: Stylet Placement Confirmation: ETT inserted through vocal cords under direct vision,  positive ETCO2 and breath sounds checked- equal and bilateral Secured at: 22 cm Tube secured with: Tape Dental Injury: Teeth and Oropharynx as per pre-operative assessment

## 2015-01-14 NOTE — Progress Notes (Signed)
Patient Demographics  [name] [name], is a 39 y.o. male, DOB - December 26, 1975, UUV:[id number]  Admit date - 01/13/2015   Admitting Physician Eduard Clos, MD  Outpatient Primary MD for the patient is No PCP Per Patient  LOS - 0   Chief Complaint  Patient presents with  . 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 Went to or for irrigation and debridement of right foot abscess with removal of foreign body from right foot.  Subjective:   [name] [name] today has, No headache, No chest pain, No abdominal pain - No Nausea, complaints of pain in right foot . Assessment & Plan    Principal Problem:   Cellulitis of right foot  Right foot foreign body and abscess/cellulitis - Orthopedic consult greatly appreciated, patient went to or on 11/24 with irrigation and debridement of right foot abscess in multiple areas, and removal of foreign body from right foot. - Continue with broad-spectrum IV antibiotic vancomycin and Zosyn, follow on abscess cultures  Polysubstance abuse - screen positive for THC, cocaine - Counseled  Chest pain - No recurrence, nontypical, troponin negative  Code Status: full  Family Communication: none at bedside  Disposition Plan: home when stable   Procedures   Right foot abscess incision and drainage on 11/24   Consults   Orthopedics   Medications  Scheduled Meds: . docusate sodium  100 mg Oral BID  . [START ON 01/15/2015] enoxaparin (LOVENOX) injection  40 mg  Subcutaneous Q24H  . senna  1 tablet Oral BID  . vancomycin  1,000 mg Intravenous Q8H   Continuous Infusions: . sodium chloride    . sodium chloride     PRN Meds:.acetaminophen **OR** acetaminophen, ondansetron **OR** ondansetron (ZOFRAN) IV, oxyCODONE  DVT Prophylaxis  Lovenox   Lab Results  Component Value Date   PLT 141* 01/14/2015    Antibiotics    Anti-infectives    Start     Dose/Rate Route Frequency Ordered Stop   01/14/15 0800  vancomycin (VANCOCIN) IVPB 1000 mg/200 mL premix     1,000 mg 200 mL/hr over 60 Minutes Intravenous Every 8 hours 01/14/15 0600     01/14/15 0545  vancomycin (VANCOCIN) IVPB 1000 mg/200 mL premix  Status:  Discontinued     1,000 mg 200 mL/hr over 60 Minutes Intravenous  Once 01/14/15 0543 01/14/15 0601   01/14/15 0130  piperacillin-tazobactam (ZOSYN) IVPB 3.375 g     3.375 g 100 mL/hr over 30 Minutes Intravenous  Once 01/14/15 0118 01/14/15 0221   01/13/15 2345  vancomycin (VANCOCIN) IVPB 1000 mg/200 mL premix     1,000 mg 200 mL/hr over 60 Minutes Intravenous  Once 01/13/15 2332 01/14/15 0143          Objective:   Filed Vitals:   01/14/15 1340 01/14/15 1343 01/14/15 1345 01/14/15 1400  BP:  116/70  113/74  Pulse: 94 82 73 68  Temp:  98.3 F (36.8 C)  98.2 F (36.8 C)  TempSrc:    Oral  Resp: 15 16 24 18   Height:      Weight:      SpO2: 94% 96% 98% 100%    Wt Readings from Last 3 Encounters:  01/14/15 79.379 kg (175 lb)    No intake or output data in the 24 hours ending 01/14/15 1411   Physical Exam  Awake Alert, Oriented X 3, No new F.N deficits, Normal affect [place].AT,PERRAL Supple Neck,No JVD, No cervical lymphadenopathy appriciated.  Symmetrical Chest wall movement, Good air movement bilaterally, CTAB RRR,No Gallops,Rubs or new Murmurs, No Parasternal Heave +ve B.Sounds, Abd Soft, No tenderness, No organomegaly appriciated, No rebound - guarding or rigidity. No Cyanosis, right foot edema, erythema and tenderness to  palpation   Data Review   Micro Results No results found for this or any previous visit (from the past 240 hour(s)).  Radiology Reports Dg Chest 2 View  01/14/2015  CLINICAL DATA:  Chest pain for 1 week. Sudden onset right foot swelling. No injury. EXAM: CHEST  2 VIEW COMPARISON:  None. FINDINGS: The heart size and mediastinal contours are within normal limits. Both lungs are clear. The visualized skeletal structures are unremarkable. IMPRESSION: No active cardiopulmonary disease. Electronically Signed   By: Burman NievesWilliam  Stevens M.D.   On: 01/14/2015 00:36   Dg Foot Complete Right  01/14/2015  CLINICAL DATA:  Acute onset of right foot swelling and oozing between the toes. Initial encounter. EXAM: RIGHT FOOT COMPLETE - 3+ VIEW COMPARISON:  None. FINDINGS: There is no evidence of fracture or dislocation. The joint spaces are preserved. There is no evidence of talar subluxation; the subtalar joint is unremarkable in appearance. Dorsal soft tissue swelling is noted at the forefoot, with suggestion of a 5 mm metallic needle fragment embedded 5 mm deep to the skin surface at the dorsal soft tissues, projecting between the first and second metatarsals. IMPRESSION: 1. No evidence of fracture or dislocation. 2. Dorsal soft tissue swelling, with suggestion of a 5 mm metallic needle fragment embedded 5 mm deep to the skin surface at the dorsal soft tissues, projecting between the first and second metatarsals. Electronically Signed   By: Roanna RaiderJeffery  Chang M.D.   On: 01/14/2015 00:36     CBC  Recent Labs Lab 01/13/15 2330 01/14/15 0613  WBC 9.8 8.1  HGB 14.0 13.1  HCT 43.7 40.5  PLT 165 141*  MCV 89.9 91.4  MCH 28.8 29.6  MCHC 32.0 32.3  RDW 13.4 13.6  LYMPHSABS 2.1  --   MONOABS 1.0  --   EOSABS 0.1  --   BASOSABS 0.0  --     Chemistries   Recent Labs Lab 01/13/15 2330 01/14/15 0613  NA 137  --   K 3.5  --   CL 104  --   CO2 26  --   GLUCOSE 105*  --   BUN 15  --   CREATININE 1.18  1.26*  CALCIUM 8.6*  --    ------------------------------------------------------------------------------------------------------------------ estimated creatinine clearance is 82.1 mL/min (by C-G formula based on Cr of 1.26). ------------------------------------------------------------------------------------------------------------------ No results for input(s): HGBA1C in the last 72 hours. ------------------------------------------------------------------------------------------------------------------ No results for input(s): CHOL, HDL, LDLCALC, TRIG, CHOLHDL, LDLDIRECT in the last 72 hours. ------------------------------------------------------------------------------------------------------------------ No results for input(s): TSH, T4TOTAL, T3FREE, THYROIDAB in the last 72 hours.  Invalid input(s): FREET3 ------------------------------------------------------------------------------------------------------------------ No results for input(s): VITAMINB12, FOLATE, FERRITIN, TIBC, IRON, RETICCTPCT in  the last 72 hours.  Coagulation profile No results for input(s): INR, PROTIME in the last 168 hours.  No results for input(s): DDIMER in the last 72 hours.  Cardiac Enzymes  Recent Labs Lab 01/13/15 2330  TROPONINI <0.03   ------------------------------------------------------------------------------------------------------------------ Invalid input(s): POCBNP     Time Spent in minutes   30 minutes   Kinjal Neitzke M.D on 01/14/2015 at 2:11 PM  Between 7am to 7pm - Pager - [phone]  After 7pm go to [email or web address] - password [facility]  [facility]   Office  [phone]

## 2015-01-14 NOTE — Progress Notes (Signed)
ANTIBIOTIC CONSULT NOTE - INITIAL  Pharmacy Consult for Vancomycin  Indication: Cellulitis  No Known Allergies  Patient Measurements: Height: 5\' 7"  (170.2 cm) Weight: 175 lb (79.379 kg) IBW/kg (Calculated) : 66.1   Labs:  Recent Labs  01/13/15 2330  WBC 9.8  HGB 14.0  PLT 165  CREATININE 1.18   Estimated Creatinine Clearance: 85.7 mL/min (by C-G formula based on Cr of 1.18).  Medical History: Past Medical History  Diagnosis Date  . Shortness of breath dyspnea     Assessment: 39 y/o with right lower extremity cellulitis, WBC WNL, renal function good, starting vancomycin per pharmacy.   Goal of Therapy:  Vancomycin trough level 10-15 mcg/ml  Plan:  -Vancomycin 1000 mg IV q8h -Drug levels at steady state  Abran DukeLedford, [place] 01/14/2015,5:54 AM

## 2015-01-15 LAB — BASIC METABOLIC PANEL
ANION GAP: 6 (ref 5–15)
BUN: 10 mg/dL (ref 6–20)
CALCIUM: 7.9 mg/dL — AB (ref 8.9–10.3)
CHLORIDE: 106 mmol/L (ref 101–111)
CO2: 24 mmol/L (ref 22–32)
Creatinine, Ser: 1.33 mg/dL — ABNORMAL HIGH (ref 0.61–1.24)
GFR calc non Af Amer: >60 mL/min (ref >60)
GLUCOSE: 118 mg/dL — AB (ref 65–99)
POTASSIUM: 4 mmol/L (ref 3.5–5.1)
Sodium: 136 mmol/L (ref 135–145)

## 2015-01-15 LAB — CBC
HEMATOCRIT: 38.3 % — AB (ref 39.0–52.0)
HEMOGLOBIN: 12.9 g/dL — AB (ref 13.0–17.0)
MCH: 30.6 pg (ref 26.0–34.0)
MCHC: 33.7 g/dL (ref 30.0–36.0)
MCV: 91 fL (ref 78.0–100.0)
Platelets: 150 10*3/uL (ref 150–400)
RBC: 4.21 MIL/uL — AB (ref 4.22–5.81)
RDW: 13.3 % (ref 11.5–15.5)
WBC: 10.1 10*3/uL (ref 4.0–10.5)

## 2015-01-15 LAB — VANCOMYCIN, TROUGH: Vancomycin Tr: 11 ug/mL (ref 10.0–20.0)

## 2015-01-15 NOTE — Progress Notes (Signed)
   Subjective:  Had I&D R foot abscess yesterday. Patient reports pain as mild to moderate.  States foot is feeling a little better.   Objective:   VITALS:   Filed Vitals:   01/14/15 2151 01/14/15 2328 01/15/15 0247 01/15/15 0436  BP:   115/69 127/70  Pulse:   77 75  Temp: 99.3 F (37.4 C) 99.6 F (37.6 C) 100.9 F (38.3 C) 99.6 F (37.6 C)  TempSrc: Oral Oral Oral Oral  Resp:    18  Height:      Weight:    79.379 kg (175 lb)  SpO2:   98% 98%   NAD ABD soft Dorsiflexion/Plantar flexion intact Incision: dressing C/D/I BCR toes  Lab Results  Component Value Date   WBC 10.1 01/15/2015   HGB 12.9* 01/15/2015   HCT 38.3* 01/15/2015   MCV 91.0 01/15/2015   PLT 150 01/15/2015   BMET    Component Value Date/Time   NA 136 01/15/2015 0232   K 4.0 01/15/2015 0232   CL 106 01/15/2015 0232   CO2 24 01/15/2015 0232   GLUCOSE 118* 01/15/2015 0232   BUN 10 01/15/2015 0232   CREATININE 1.33* 01/15/2015 0232   CALCIUM 7.9* 01/15/2015 0232   GFRNONAA >60 01/15/2015 0232   GFRAA >60 01/15/2015 0232    Intraop cx: gram stain showed GPC in clusters, culture pending   Assessment/Plan: 1 Day Post-Op   Principal Problem:   Cellulitis of right foot   WBAT through heel in postop shoe Cont IV abx Follow culture D/C penrose drain and change dressing tomorrow   Malania Gawthrop, Cloyde ReamsBrian Axyl 01/15/2015, 9:38 AM

## 2015-01-15 NOTE — Progress Notes (Signed)
Utilization review completed.  

## 2015-01-15 NOTE — Progress Notes (Signed)
ANTIBIOTIC CONSULT NOTE - FOLLOW UP  Pharmacy Consult for Vancomycin Indication: cellulitis  No Known Allergies  Patient Measurements: Height:  (177.8 cm) Weight: 175 lb (79.379 kg) IBW/kg (Calculated) : 73 Adjusted Body Weight: n/a  Vital Signs: Temp: 100.4 F (38 C) (11/25 1801) Temp Source: Oral (11/25 1801) BP: 123/60 mmHg (11/25 1801) Pulse Rate: 90 (11/25 1801) Intake/Output from previous day: 11/24 0701 - 11/25 0700 In: 0  Out: 600 [Urine:600] Intake/Output from this shift:    Labs:  Recent Labs  01/13/15 2330 01/14/15 0613 01/15/15 0232  WBC 9.8 8.1 10.1  HGB 14.0 13.1 12.9*  PLT 165 141* 150  CREATININE 1.18 1.26* 1.33*   Estimated Creatinine Clearance: 77.8 mL/min (by C-G formula based on Cr of 1.33).  Recent Labs  01/15/15 1837  VANCOTROUGH 11     Microbiology: Recent Results (from the past 720 hour(s))  Blood culture (routine x 2)     Status: None (Preliminary result)   Collection Time: 01/13/15 11:30 PM  Result Value Ref Range Status   Specimen Description BLOOD RIGHT ANTECUBITAL  Final   Special Requests   Final    BOTTLES DRAWN AEROBIC AND ANAEROBIC AER 10cc ANA 10cc   Culture   Final    NO GROWTH 1 DAY Performed at [facility]    Report Status PENDING  Incomplete  Blood culture (routine x 2)     Status: None (Preliminary result)   Collection Time: 01/14/15 12:05 AM  Result Value Ref Range Status   Specimen Description BLOOD LEFT HAND  Final   Special Requests   Final    BOTTLES DRAWN AEROBIC AND ANAEROBIC AER 10cc ANA 10cc   Culture   Final    NO GROWTH 1 DAY Performed at [facility]    Report Status PENDING  Incomplete  Tissue culture     Status: None (Preliminary result)   Collection Time: 01/14/15  1:31 PM  Result Value Ref Range Status   Specimen Description TISSUE FOOT RIGHT  Final   Special Requests NONE  Final   Gram Stain   Final    MODERATE WBC PRESENT,BOTH PMN AND MONONUCLEAR RARE GRAM  POSITIVE COCCI IN CLUSTERS Performed at [facility]    Culture   Final    Culture reincubated for better growth Performed at [facility]    Report Status PENDING  Incomplete    Anti-infectives    Start     Dose/Rate Route Frequency Ordered Stop   01/14/15 1530  piperacillin-tazobactam (ZOSYN) IVPB 3.375 g     3.375 g 12.5 mL/hr over 240 Minutes Intravenous 3 times per day 01/14/15 1427     01/14/15 0800  vancomycin (VANCOCIN) IVPB 1000 mg/200 mL premix     1,000 mg 200 mL/hr over 60 Minutes Intravenous Every 8 hours 01/14/15 0600     01/14/15 0545  vancomycin (VANCOCIN) IVPB 1000 mg/200 mL premix  Status:  Discontinued     1,000 mg 200 mL/hr over 60 Minutes Intravenous  Once 01/14/15 0543 01/14/15 0601   01/14/15 0130  piperacillin-tazobactam (ZOSYN) IVPB 3.375 g     3.375 g 100 mL/hr over 30 Minutes Intravenous  Once 01/14/15 0118 01/14/15 0221   01/13/15 2345  vancomycin (VANCOCIN) IVPB 1000 mg/200 mL premix     1,000 mg 200 mL/hr over 60 Minutes Intravenous  Once 01/13/15 2332 01/14/15 0143      Assessment: 39 yo male with R foot abscess, cellulitis.  Vancomycin trough level =  11, at goal.  Scr 1.33, CrCl ~ 75 ml/min.  Goal of Therapy:  Vancomycin trough level 10-15 mcg/ml  Plan:  1. Continue vancomycin 1g IV q 8 hrs. 2. Watch renal function, clinical course.  Tad MooreJessica Maranatha Grossi, Pharm D, BCPS  Clinical Pharmacist Pager [phone]  01/15/2015 7:32 PM

## 2015-01-15 NOTE — Progress Notes (Signed)
   01/15/15 0247  Vitals  Temp (!) 100.9 F (38.3 C) (given tylenol)  Temp Source Oral  BP 115/69 mmHg  BP Location Left Arm  BP Method Automatic  Patient Position (if appropriate) Lying  Pulse Rate 77  Pulse Rate Source Dinamap  Oxygen Therapy  SpO2 98 %  O2 Device Room Air  Pain Assessment  Pain Assessment No/denies pain    Patient have an elevated temperature. In no acute distress. Given tylenol and will continue to monitor.   [name] [name] [name]

## 2015-01-15 NOTE — Progress Notes (Signed)
Patient Demographics  [name] [name], is a 39 y.o. male, DOB - 1976-02-04, GNF:[id number]  Admit date - 01/13/2015   Admitting Physician Eduard Clos, MD  Outpatient Primary MD for the patient is No PCP Per Patient  LOS - 1   Chief Complaint  Patient presents with  . 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 Went to or for irrigation and debridement of right foot abscess with removal of foreign body from right foot.  Subjective:   Mauri Reading today has, No headache, No chest pain, No abdominal pain - No Nausea, right foot pain is improving. Assessment & Plan    Principal Problem:   Cellulitis of right foot  Right foot foreign body and abscess/cellulitis - Orthopedic consult greatly appreciated, patient went to or on 11/24 with irrigation and debridement of right foot abscess in multiple areas, and removal of foreign body from right foot. - Continue with broad-spectrum IV antibiotic vancomycin and Zosyn, follow on abscess cultures, Gram stain significant for gram-positive cocci in clusters,  Polysubstance abuse - screen positive for THC, cocaine - Counseled  Chest pain - No recurrence, nontypical, troponin negative  Code Status: full  Family Communication: none at bedside  Disposition Plan: home when stable   Procedures   Right foot abscess incision and drainage on 11/24   Consults   Orthopedics   Medications  Scheduled Meds: . docusate sodium  100 mg Oral BID  . enoxaparin  (LOVENOX) injection  40 mg Subcutaneous Q24H  . piperacillin-tazobactam (ZOSYN)  IV  3.375 g Intravenous 3 times per day  . senna  1 tablet Oral BID  . vancomycin  1,000 mg Intravenous Q8H   Continuous Infusions: . sodium chloride 100 mL/hr at 01/14/15 1408  . sodium chloride 75 mL/hr at 01/14/15 1414   PRN Meds:.acetaminophen **OR** acetaminophen, ondansetron **OR** ondansetron (ZOFRAN) IV, oxyCODONE  DVT Prophylaxis  Lovenox   Lab Results  Component Value Date   PLT 150 01/15/2015    Antibiotics    Anti-infectives    Start     Dose/Rate Route Frequency Ordered Stop   01/14/15 1530  piperacillin-tazobactam (ZOSYN) IVPB 3.375 g     3.375 g 12.5 mL/hr over 240 Minutes Intravenous 3 times per day 01/14/15 1427     01/14/15 0800  vancomycin (VANCOCIN) IVPB 1000 mg/200 mL premix     1,000 mg 200 mL/hr over 60 Minutes Intravenous Every 8 hours 01/14/15 0600     01/14/15 0545  vancomycin (VANCOCIN) IVPB 1000 mg/200 mL premix  Status:  Discontinued     1,000 mg 200 mL/hr over 60 Minutes Intravenous  Once 01/14/15 0543 01/14/15 0601   01/14/15 0130  piperacillin-tazobactam (ZOSYN) IVPB 3.375 g     3.375 g 100 mL/hr over 30 Minutes Intravenous  Once 01/14/15 0118 01/14/15 0221   01/13/15 2345  vancomycin (VANCOCIN) IVPB  1000 mg/200 mL premix     1,000 mg 200 mL/hr over 60 Minutes Intravenous  Once 01/13/15 2332 01/14/15 0143          Objective:   Filed Vitals:   01/15/15 0247 01/15/15 0436 01/15/15 1029 01/15/15 1335  BP: 115/69 127/70 125/69 113/62  Pulse: 77 75 88 78  Temp: 100.9 F (38.3 C) 99.6 F (37.6 C) 99.3 F (37.4 C) 99.4 F (37.4 C)  TempSrc: Oral Oral Oral Oral  Resp:  18 18 20   Height:      Weight:  79.379 kg (175 lb)    SpO2: 98% 98% 100% 98%    Wt Readings from Last 3 Encounters:  01/15/15 79.379 kg (175 lb)     Intake/Output Summary (Last 24 hours) at 01/15/15 1608 Last data filed at 01/15/15 0834  Gross per 24 hour  Intake    360 ml   Output    600 ml  Net   -240 ml     Physical Exam  Awake Alert, Oriented X 3, No new F.N deficits, Normal affect [place].AT,PERRAL Supple Neck,No JVD, No cervical lymphadenopathy appriciated.  Symmetrical Chest wall movement, Good air movement bilaterally, CTAB RRR,No Gallops,Rubs or new Murmurs, No Parasternal Heave +ve B.Sounds, Abd Soft, No tenderness, No organomegaly appriciated, No rebound - guarding or rigidity. No Cyanosis, right foot bandaged,    Data Review   Micro Results Recent Results (from the past 240 hour(s))  Blood culture (routine x 2)     Status: None (Preliminary result)   Collection Time: 01/13/15 11:30 PM  Result Value Ref Range Status   Specimen Description BLOOD RIGHT ANTECUBITAL  Final   Special Requests   Final    BOTTLES DRAWN AEROBIC AND ANAEROBIC AER 10cc ANA 10cc   Culture   Final    NO GROWTH 1 DAY [facility]    Report Status PENDING  Incomplete  Blood culture (routine x 2)     Status: None (Preliminary result)   Collection Time: 01/14/15 12:05 AM  Result Value Ref Range Status   Specimen Description BLOOD LEFT HAND  Final   Special Requests   Final    BOTTLES DRAWN AEROBIC AND ANAEROBIC AER 10cc ANA 10cc   Culture   Final    NO GROWTH 1 DAY [facility]formed [facility]    Report Status PENDING  Incomplete  Tissue culture     Status: None (Preliminary result)   Collection Time: 01/14/15  1:31 PM  Result Value Ref Range Status   Specimen Description TISSUE FOOT RIGHT  Final   Special Requests NONE  Final   Gram Stain   Final    MODERATE WBC PRESENT,BOTH PMN AND MONONUCLEAR RARE GRAM POSITIVE COCCI IN CLUSTERS Performed at [facility]    Culture   Final    Culture reincubated for better growth Performed at [facility]    Report Status PENDING  Incomplete    Radiology Reports Dg Chest 2 View  01/14/2015  CLINICAL DATA:  Chest pain for 1 week. Sudden onset right foot swelling. No injury.  EXAM: CHEST  2 VIEW COMPARISON:  None. FINDINGS: The heart size and mediastinal contours are within normal limits. Both lungs are clear. The visualized skeletal structures are unremarkable. IMPRESSION: No active cardiopulmonary disease. Electronically Signed   By: Burman NievesWilliam  Stevens M.D.   On: 01/14/2015 00:36   Dg Foot Complete Right  01/14/2015  CLINICAL DATA:  Acute onset of right foot swelling  and oozing between the toes. Initial encounter. EXAM: RIGHT FOOT COMPLETE - 3+ VIEW COMPARISON:  None. FINDINGS: There is no evidence of fracture or dislocation. The joint spaces are preserved. There is no evidence of talar subluxation; the subtalar joint is unremarkable in appearance. Dorsal soft tissue swelling is noted at the forefoot, with suggestion of a 5 mm metallic needle fragment embedded 5 mm deep to the skin surface at the dorsal soft tissues, projecting between the first and second metatarsals. IMPRESSION: 1. No evidence of fracture or dislocation. 2. Dorsal soft tissue swelling, with suggestion of a 5 mm metallic needle fragment embedded 5 mm deep to the skin surface at the dorsal soft tissues, projecting between the first and second metatarsals. Electronically Signed   By: Roanna Raider M.D.   On: 01/14/2015 00:36     CBC  Recent Labs Lab 01/13/15 2330 01/14/15 0613 01/15/15 0232  WBC 9.8 8.1 10.1  HGB 14.0 13.1 12.9*  HCT 43.7 40.5 38.3*  PLT 165 141* 150  MCV 89.9 91.4 91.0  MCH 28.8 29.6 30.6  MCHC 32.0 32.3 33.7  RDW 13.4 13.6 13.3  LYMPHSABS 2.1  --   --   MONOABS 1.0  --   --   EOSABS 0.1  --   --   BASOSABS 0.0  --   --     Chemistries   Recent Labs Lab 01/13/15 2330 01/14/15 0613 01/15/15 0232  NA 137  --  136  K 3.5  --  4.0  CL 104  --  106  CO2 26  --  24  GLUCOSE 105*  --  118*  BUN 15  --  10  CREATININE 1.18 1.26* 1.33*  CALCIUM 8.6*  --  7.9*    ------------------------------------------------------------------------------------------------------------------ estimated creatinine clearance is 77.8 mL/min (by C-G formula based on Cr of 1.33). ------------------------------------------------------------------------------------------------------------------ No results for input(s): HGBA1C in the last 72 hours. ------------------------------------------------------------------------------------------------------------------ No results for input(s): CHOL, HDL, LDLCALC, TRIG, CHOLHDL, LDLDIRECT in the last 72 hours. ------------------------------------------------------------------------------------------------------------------ No results for input(s): TSH, T4TOTAL, T3FREE, THYROIDAB in the last 72 hours.  Invalid input(s): FREET3 ------------------------------------------------------------------------------------------------------------------ No results for input(s): VITAMINB12, FOLATE, FERRITIN, TIBC, IRON, RETICCTPCT in the last 72 hours.  Coagulation profile No results for input(s): INR, PROTIME in the last 168 hours.  No results for input(s): DDIMER in the last 72 hours.  Cardiac Enzymes  Recent Labs Lab 01/13/15 2330  TROPONINI <0.03   ------------------------------------------------------------------------------------------------------------------ Invalid input(s): POCBNP     Time Spent in minutes   25 minutes   Lorenz Donley M.D on 01/15/2015 at 4:08 PM  Between 7am to 7pm - Pager - [phone]  After 7pm go to [email or web address] - password [facility]  [facility]   Office  [phone]

## 2015-01-16 NOTE — Progress Notes (Signed)
Patient ID: [name] [name], male   DOB: [birth year], 39 y.o.   MRN: [id number]    Subjective: 2 Days Post-Op Procedure(s) (LRB): IRRIGATION AND DEBRIDEMENT EXTREMITY (Right) REMOVAL FOREIGN BODY EXTREMITY (Right) Patient reports pain as 3 on 0-10 scale.   Denies CP or SOB.  Voiding without difficulty. Positive flatus. Positive BM.  Reports pain with ambulation/weight bearing Objective: Vital signs in last 24 hours: Temp:  [98.6 F (37 C)-100.4 F (38 C)] 98.8 F (37.1 C) (11/26 0435) Pulse Rate:  [78-90] 79 (11/26 0435) Resp:  [18-20] 18 (11/26 0435) BP: (113-127)/(60-75) 127/75 mmHg (11/26 0435) SpO2:  [98 %-100 %] 100 % (11/26 0435)  Intake/Output from previous day: 11/25 0701 - 11/26 0700 In: 1200 [P.O.:1200] Out: 375 [Urine:375] Intake/Output this shift:    Labs:  Recent Labs  01/13/15 2330 01/14/15 0613 01/15/15 0232  HGB 14.0 13.1 12.9*    Recent Labs  01/14/15 0613 01/15/15 0232  WBC 8.1 10.1  RBC 4.43 4.21*  HCT 40.5 38.3*  PLT 141* 150    Recent Labs  01/13/15 2330 01/14/15 0613 01/15/15 0232  NA 137  --  136  K 3.5  --  4.0  CL 104  --  106  CO2 26  --  24  BUN 15  --  10  CREATININE 1.18 1.26* 1.33*  GLUCOSE 105*  --  118*  CALCIUM 8.6*  --  7.9*   No results for input(s): LABPT, INR in the last 72 hours.  Physical Exam: Neurologically intact ABD soft Sensation intact distally Dorsiflexion/Plantar flexion intact Incision: 2 inches, no erythema, open, slight drainage, no sign of infection Compartment soft  Assessment/Plan: 2 Days Post-Op Procedure(s) (LRB): IRRIGATION AND DEBRIDEMENT EXTREMITY (Right) REMOVAL FOREIGN BODY EXTREMITY (Right) Advance diet Up with therapy  Pulled penrose drain, cleaned and rewrapped right foot   Mayo, Baxter Kailarmen Christina for Dr. Venita Lickahari Brooks [facility] [phone] 01/16/2015, 9:44 AM

## 2015-01-16 NOTE — Progress Notes (Signed)
Patient Demographics  [name] [name], is a 39 y.o. male, DOB - 10-28-1975, WNU:[id number]  Admit date - 01/13/2015   Admitting Physician Eduard Clos, MD  Outpatient Primary MD for the patient is No PCP Per Patient  LOS - 2   Chief Complaint  Patient presents with  . 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 Went to or for irrigation and debridement of right foot abscess with removal of foreign body from right foot.  Subjective:   [name] [name] today has, No headache, No chest pain, No abdominal pain - No Nausea, right foot pain is improving. Assessment & Plan    Principal Problem:   Cellulitis of right foot  Right foot foreign body and abscess/cellulitis - Orthopedic consult greatly appreciated, patient went to or on 11/24 with irrigation and debridement of right foot abscess in multiple areas, and removal of foreign body from right foot. - Patient still with fever 100.4 over last 24 hour , so will Continue with broad-spectrum IV antibiotic vancomycin and Zosyn, follow on abscess cultures, Gram stain significant for gram-positive cocci in clusters, - Wound was checked by orthopedics today, drain was pulled.  Polysubstance abuse - screen positive for THC, cocaine - Counseled  Chest pain - No recurrence, nontypical, troponin negative  Code Status: full  Family Communication: none at bedside  Disposition Plan: home when stable   Procedures   Right foot abscess incision and drainage on  11/24   Consults   Orthopedics   Medications  Scheduled Meds: . docusate sodium  100 mg Oral BID  . enoxaparin (LOVENOX) injection  40 mg Subcutaneous Q24H  . piperacillin-tazobactam (ZOSYN)  IV  3.375 g Intravenous 3 times per day  . senna  1 tablet Oral BID  . vancomycin  1,000 mg Intravenous Q8H   Continuous Infusions: . sodium chloride 100 mL/hr at 01/14/15 1408  . sodium chloride 75 mL/hr at 01/15/15 2250   PRN Meds:.acetaminophen **OR** acetaminophen, ondansetron **OR** ondansetron (ZOFRAN) IV, oxyCODONE  DVT Prophylaxis  Lovenox   Lab Results  Component Value Date   PLT 150 01/15/2015    Antibiotics    Anti-infectives    Start     Dose/Rate Route Frequency Ordered Stop   01/14/15 1530  piperacillin-tazobactam (ZOSYN) IVPB 3.375 g     3.375 g 12.5 mL/hr over 240 Minutes Intravenous 3 times per day 01/14/15 1427     01/14/15 0800  vancomycin (VANCOCIN) IVPB 1000 mg/200 mL premix     1,000 mg 200 mL/hr over 60 Minutes Intravenous Every 8 hours 01/14/15 0600     01/14/15 0545  vancomycin (VANCOCIN) IVPB 1000 mg/200 mL premix  Status:  Discontinued     1,000 mg 200 mL/hr over 60 Minutes Intravenous  Once 01/14/15 0543 01/14/15 0601   01/14/15 0130  piperacillin-tazobactam (ZOSYN) IVPB 3.375 g  3.375 g 100 mL/hr over 30 Minutes Intravenous  Once 01/14/15 0118 01/14/15 0221   01/13/15 2345  vancomycin (VANCOCIN) IVPB 1000 mg/200 mL premix     1,000 mg 200 mL/hr over 60 Minutes Intravenous  Once 01/13/15 2332 01/14/15 0143          Objective:   Filed Vitals:   01/15/15 1801 01/15/15 2055 01/16/15 0047 01/16/15 0435  BP: 123/60 118/69  127/75  Pulse: 90 78  79  Temp: 100.4 F (38 C) 98.9 F (37.2 C) 98.6 F (37 C) 98.8 F (37.1 C)  TempSrc: Oral Oral Oral Oral  Resp: Height:      Weight:      SpO2: 98% 98%  100%    Wt Readings from Last 3 Encounters:  01/15/15 79.379 kg (175 lb)     Intake/Output Summary (Last 24 hours) at  01/16/15 1345 Last data filed at 01/16/15 0700  Gross per 24 hour  Intake    840 ml  Output    375 ml  Net    465 ml     Physical Exam  Awake Alert, Oriented X 3, No new F.N deficits, Normal affect .AT,PERRAL Supple Neck,No JVD, No cervical lymphadenopathy appriciated.  Symmetrical Chest wall movement, Good air movement bilaterally, CTAB RRR,No Gallops,Rubs or new Murmurs, No Parasternal Heave +ve B.Sounds, Abd Soft, No tenderness, No organomegaly appriciated, No rebound - guarding or rigidity. No Cyanosis, right foot bandaged,    Data Review   Micro Results Recent Results (from the past 240 hour(s))  Blood culture (routine x 2)     Status: None (Preliminary result)   Collection Time: 01/13/15 11:30 PM  Result Value Ref Range Status   Specimen Description BLOOD RIGHT ANTECUBITAL  Final   Special Requests   Final    BOTTLES DRAWN AEROBIC AND ANAEROBIC AER 10cc ANA 10cc   Culture   Final    NO GROWTH 2 DAYS Performed at [facility]    Report Status PENDING  Incomplete  Blood culture (routine x 2)     Status: None (Preliminary result)   Collection Time: 01/14/15 12:05 AM  Result Value Ref Range Status   Specimen Description BLOOD LEFT HAND  Final   Special Requests   Final    BOTTLES DRAWN AEROBIC AND ANAEROBIC AER 10cc ANA 10cc   Culture   Final    NO GROWTH 2 DAYS Performed at [facility]    Report Status PENDING  Incomplete  Tissue culture     Status: None (Preliminary result)   Collection Time: 01/14/15  1:31 PM  Result Value Ref Range Status   Specimen Description TISSUE FOOT RIGHT  Final   Special Requests NONE  Final   Gram Stain   Final    MODERATE WBC PRESENT,BOTH PMN AND MONONUCLEAR RARE GRAM POSITIVE COCCI IN CLUSTERS Performed at [facility]    Culture   Final    ABUNDANT STAPHYLOCOCCUS AUREUS Note: RIFAMPIN AND GENTAMICIN SHOULD NOT BE USED AS SINGLE DRUGS FOR TREATMENT OF STAPH INFECTIONS. Performed at [facility]    Report Status PENDING  Incomplete  Anaerobic culture     Status: None (Preliminary result)   Collection Time: 01/14/15  1:32 PM  Result Value Ref Range Status   Specimen Description TISSUE FOOT RIGHT  Final   Special Requests NONE  Final   Gram Stain PENDING  Incomplete   Culture   Final    NO ANAEROBES  ISOLATED; CULTURE IN PROGRESS FOR 5 DAYS Performed at [facility]    Report Status PENDING  Incomplete    Radiology Reports Dg Chest 2 View  01/14/2015  CLINICAL DATA:  Chest pain for 1 week. Sudden onset right foot swelling. No injury. EXAM: CHEST  2 VIEW COMPARISON:  None. FINDINGS: The heart size and mediastinal contours are within normal limits. Both lungs are clear. The visualized skeletal structures are unremarkable. IMPRESSION: No active cardiopulmonary disease. Electronically Signed   By: Burman NievesWilliam  Stevens M.D.   On: 01/14/2015 00:36   Dg Foot Complete Right  01/14/2015  CLINICAL DATA:  Acute onset of right foot swelling and oozing between the toes. Initial encounter. EXAM: RIGHT FOOT COMPLETE - 3+ VIEW COMPARISON:  None. FINDINGS: There is no evidence of fracture or dislocation. The joint spaces are preserved. There is no evidence of talar subluxation; the subtalar joint is unremarkable in appearance. Dorsal soft tissue swelling is noted at the forefoot, with suggestion of a 5 mm metallic needle fragment embedded 5 mm deep to the skin surface at the dorsal soft tissues, projecting between the first and second metatarsals. IMPRESSION: 1. No evidence of fracture or dislocation. 2. Dorsal soft tissue swelling, with suggestion of a 5 mm metallic needle fragment embedded 5 mm deep to the skin surface at the dorsal soft tissues, projecting between the first and second metatarsals. Electronically Signed   By: Roanna RaiderJeffery  Chang M.D.   On: 01/14/2015 00:36     CBC  Recent Labs Lab 01/13/15 2330 01/14/15 0613 01/15/15 0232  WBC 9.8 8.1 10.1  HGB 14.0 13.1 12.9*  HCT  43.7 40.5 38.3*  PLT 165 141* 150  MCV 89.9 91.4 91.0  MCH 28.8 29.6 30.6  MCHC 32.0 32.3 33.7  RDW 13.4 13.6 13.3  LYMPHSABS 2.1  --   --   MONOABS 1.0  --   --   EOSABS 0.1  --   --   BASOSABS 0.0  --   --     Chemistries   Recent Labs Lab 01/13/15 2330 01/14/15 0613 01/15/15 0232  NA 137  --  136  K 3.5  --  4.0  CL 104  --  106  CO2 26  --  24  GLUCOSE 105*  --  118*  BUN 15  --  10  CREATININE 1.18 1.26* 1.33*  CALCIUM 8.6*  --  7.9*   ------------------------------------------------------------------------------------------------------------------ estimated creatinine clearance is 77.8 mL/min (by C-G formula based on Cr of 1.33). ------------------------------------------------------------------------------------------------------------------ No results for input(s): HGBA1C in the last 72 hours. ------------------------------------------------------------------------------------------------------------------ No results for input(s): CHOL, HDL, LDLCALC, TRIG, CHOLHDL, LDLDIRECT in the last 72 hours. ------------------------------------------------------------------------------------------------------------------ No results for input(s): TSH, T4TOTAL, T3FREE, THYROIDAB in the last 72 hours.  Invalid input(s): FREET3 ------------------------------------------------------------------------------------------------------------------ No results for input(s): VITAMINB12, FOLATE, FERRITIN, TIBC, IRON, RETICCTPCT in the last 72 hours.  Coagulation profile No results for input(s): INR, PROTIME in the last 168 hours.  No results for input(s): DDIMER in the last 72 hours.  Cardiac Enzymes  Recent Labs Lab 01/13/15 2330  TROPONINI <0.03   ------------------------------------------------------------------------------------------------------------------ Invalid input(s): POCBNP     Time Spent in minutes   25 minutes   Mayetta Castleman M.D on 01/16/2015 at 1:45  PM  Between 7am to 7pm - Pager - [phone]  After 7pm go to [email or web address] - password [email or web address]  [facility]   Office  [phone]

## 2015-01-17 LAB — BASIC METABOLIC PANEL
Anion gap: 6 (ref 5–15)
BUN: 12 mg/dL (ref 6–20)
CALCIUM: 8.5 mg/dL — AB (ref 8.9–10.3)
CO2: 26 mmol/L (ref 22–32)
CREATININE: 1.11 mg/dL (ref 0.61–1.24)
Chloride: 106 mmol/L (ref 101–111)
GFR calc Af Amer: >60 mL/min (ref >60)
Glucose, Bld: 101 mg/dL — ABNORMAL HIGH (ref 65–99)
Potassium: 4 mmol/L (ref 3.5–5.1)
SODIUM: 138 mmol/L (ref 135–145)

## 2015-01-17 LAB — CBC
HCT: 39.4 % (ref 39.0–52.0)
Hemoglobin: 13 g/dL (ref 13.0–17.0)
MCH: 29.7 pg (ref 26.0–34.0)
MCHC: 33 g/dL (ref 30.0–36.0)
MCV: 90.2 fL (ref 78.0–100.0)
PLATELETS: 180 10*3/uL (ref 150–400)
RBC: 4.37 MIL/uL (ref 4.22–5.81)
RDW: 13.1 % (ref 11.5–15.5)
WBC: 5.7 10*3/uL (ref 4.0–10.5)

## 2015-01-17 LAB — TISSUE CULTURE

## 2015-01-17 MED ORDER — OXYCODONE HCL 5 MG PO TABS: 5.0000 mg | ORAL_TABLET | Freq: Four times a day (QID) | ORAL | Status: DC | PRN

## 2015-01-17 MED ORDER — SULFAMETHOXAZOLE-TRIMETHOPRIM 800-160 MG PO TABS: 2.0000 | ORAL_TABLET | Freq: Two times a day (BID) | ORAL | Status: AC

## 2015-01-17 NOTE — Evaluation (Signed)
Physical Therapy Evaluation Patient Details Name: [name] [name] MRN: [id number] DOB: [birth year] Today's Date: 01/17/2015   History of Present Illness  Admitted with R foot pain and swelling; now s/p I&D and removal of foreign body from R foot, WBAT in post op shoe  Clinical Impression  Patient evaluated by Physical Therapy with no further acute PT needs identified. All education has been completed and the patient has no further questions.  See below for any follow-up Physical Therapy or equipment needs. PT is signing off. Thank you for this referral.     Follow Up Recommendations The potential need for Outpatient PT can be addressed at Ortho follow-up appointments.     Equipment Recommendations  Crutches    Recommendations for Other Services       Precautions / Restrictions Precautions Required Braces or Orthoses: Other Brace/Splint Other Brace/Splint: post op shoe; discussed Darco shoe with PA Restrictions Other Position/Activity Restrictions: Try to keep weight on R heel only per PA      Mobility  Bed Mobility Overal bed mobility: Modified Independent                Transfers Overall transfer level: Modified independent                  Ambulation/Gait Ambulation/Gait assistance: Supervision;Modified independent (Device/Increase time) Ambulation Distance (Feet): 200 Feet Assistive device: Crutches (single crutch and pushing IV pole) Gait Pattern/deviations: Step-through pattern;Decreased stance time - right     General Gait Details: verbal and demo cues to keep weight off of R foot  Stairs Stairs: Yes Stairs assistance: Supervision Stair Management: One rail Right;Forwards;Step to pattern Number of Stairs: 3 General stair comments: cues for sequence; opted to teach stairs to give  pt options for community access  Wheelchair Mobility    Modified Rankin (Stroke Patients Only)       Balance                                              Pertinent Vitals/Pain Pain Assessment: 0-10 Pain Score: 7  Pain Location: R foot Pain Descriptors / Indicators: Aching;Throbbing Pain Intervention(s): Limited activity within patient's tolerance;Monitored during session;Repositioned    Home Living Family/patient expects to be discharged to:: Unsure                      Prior Function Level of Independence: Independent               Hand Dominance        Extremity/Trunk Assessment   Upper Extremity Assessment: Overall WFL for tasks assessed           Lower Extremity Assessment: RLE deficits/detail RLE Deficits / Details: Keeping weight on R heel relatively well with LE externally rotated       Communication   Communication: No difficulties  Cognition Arousal/Alertness: Awake/alert Behavior During Therapy: WFL for tasks assessed/performed Overall Cognitive Status: Within Functional Limits for tasks assessed                      General Comments General comments (skin integrity, edema, etc.): Educated pt to keep R foot elevated when not walking    Exercises        Assessment/Plan    PT Assessment All further PT needs can be met in the next venue of care  PT  Diagnosis Difficulty walking;Acute pain   PT Problem List Pain  PT Treatment Interventions     PT Goals (Current goals can be found in the Care Plan section) Acute Rehab PT Goals Patient Stated Goal: to heal PT Goal Formulation: All assessment and education complete, DC therapy    Frequency     Barriers to discharge        Co-evaluation               End of Session   Activity Tolerance: Patient tolerated treatment well Patient left: in bed;with call bell/phone within reach Nurse Communication: Mobility status         Time: 3672-5500 PT Time Calculation (min) (ACUTE ONLY): 21 min   Charges:   PT Evaluation $Initial PT Evaluation Tier I: 1 Procedure     PT G CodesRoney Marion  Hamff 01/17/2015, 12:04 PM  Roney Marion, PT  Acute Rehabilitation Services Pager [phone] Office [phone]

## 2015-01-17 NOTE — Care Management Note (Signed)
Case Management Note  Patient Details  Name: [name] [name] MRN: [id number] Date of Birth: [birth year]  Subjective/Objective:     S/P I&D of foot              Action/Plan:   [facility] HH would be self pay patient states, he is not able to afford the cost. CM spoke with RN who will instruct patient on wound management. Patient will be discharged on antibiotic septra. [facility], CM made patient aware, [facility] at 1400. Teach back done patient verbalized understandng.No further CM needs Expected Discharge Date:      01/17/15            Expected Discharge Plan:  Home/Self Care  In-House Referral:     Discharge planning Services  CM Consult  Post Acute Care Choice:    Choice offered to:     DME Arranged:    DME Agency:     HH Arranged:    HH Agency:     Status of Service:  In process, will continue to follow / Completed signed off Medicare Important Message Given:    Date Medicare IM Given:    Medicare IM give by:    Date Additional Medicare IM Given:    Additional Medicare Important Message give by:     If discussed at Long Length of Stay Meetings, dates discussed:    Additional CommentsMichel Bickers:  Jodi Criscuolo, RN 01/17/2015, 10:29 PM

## 2015-01-17 NOTE — Progress Notes (Signed)
Pt discharged via WC with NT.  No s/s of distress.  Pt stable.

## 2015-01-17 NOTE — Progress Notes (Signed)
Order rcvd to discharge.  Telemetry removed and CCMD notified.  IV to right FA removed with catheter intact.  Discharge education given with prescriptions.  Pt indicates understanding.  DC instruction to change dressing daily.  Dressing removed and Pt to shower.  Wound is intact with no s/s of infection.  Will reapply dressing before discharge.  Pt denies pain to foot.

## 2015-01-17 NOTE — Discharge Summary (Signed)
[name] [name], is a 39 y.o. male  DOB 25-Jan-1976  MRN [id number].  Admission date:  01/13/2015  Admitting Physician  Eduard Clos, MD  Discharge Date:  01/17/2015   Primary MD  No PCP Per Patient  Recommendations for primary care physician for things to follow:  - Follow with orthopedic in one week - Please follow on hemoglobin A1c results - Please check Basic labs including CBC, BMP during next visit   Admission Diagnosis  Cellulitis of right foot [L03.115] IV drug abuse [F19.10] Chest pain, unspecified chest pain type [R07.9]   Discharge Diagnosis  Cellulitis of right foot [L03.115] IV drug abuse [F19.10] Chest pain, unspecified chest pain type [R07.9]    Principal Problem:   Cellulitis of right foot      Past Medical History  Diagnosis Date  . Shortness of breath dyspnea     History reviewed. No pertinent past surgical history.     History of present illness and  Hospital Course:     Kindly see H&P for history of present illness and admission details, please review complete Labs, Consult reports and Test reports for all details in brief  HPI  from the history and physical done on the day of admission 11/24 [name] [name] is a 39 y.o. male with no significant past medical history who presents with right foot swelling and pain.  The patient was in his usual state of health until about one week ago when he developed some intermittent chills. Then yesterday the patient woke up with swelling and pain in his right foot along with redness. During the day he pressed on the foot and pus came out from between his toes so he went to the ER.  In the ED, the patient had a low-grade fever and mild tachycardia. His troponin was negative and a urine drug screen showed cocaine, opiates, and THC. Lactic acid level was normal, and a foot x-ray showed what appeared to be radiopaque foreign body in  between the first and second metatarsals. 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      Hospital Course   Right foot foreign body and abscess/cellulitis - Orthopedic consult greatly appreciated, patient went to or on 11/24 with irrigation and debridement of right foot abscess in multiple areas, and removal of foreign body from right foot. - afebrile over last 24 hour, no leukocytosis , was on vancomycin and Zosyn during hospital stay total of 4 days, abscess culture growing MRSA, will continue another 10 days of oral Bactrim as an outpatient. - To follow with orthopedic as an outpatient  Polysubstance abuse - screen positive for THC, cocaine - Counseled  Chest pain - No recurrence, nontypical, troponin negative  Discharge Condition:  Stable   Follow UP  Follow-up Information    Follow up with Toni Arthurs, MD In 1 week.   Specialty:  Orthopedic Surgery   Contact information:   [address] [place] [address] [phone]       Follow up with [facility] On 01/21/2015.   Specialty:  Internal Medicine   Why:  Appointment scheduled to establish care for follow up Thursday 12/1 at 2pm at the [facility]    Contact information:   [address] [place] [address] [phone]        Discharge Instructions  and  Discharge Medications     Discharge Instructions    Discharge instructions    Complete by:  As directed   Follow with PCP on a scheduled appointment Change your dressing daily with dry gauze and an ACE bandage.  You may walk on your foot in the hard shoe.  You may take the dressing off to shower, but don't soak your foot in the bath tub. Call Dr.Hewitts office with any questions [phone]. Get CBC, CMP,checked  by Primary MD next  visit.    Activity: As tolerated with Full fall precautions use walker/cane & assistance as needed   Disposition Home    Diet:regular , with feeding assistance and aspiration precautions.  For Heart failure patients - Check your Weight same time everyday, if you gain over 2 pounds, or you develop in leg swelling, experience more shortness of breath or chest pain, call your Primary MD immediately. Follow Cardiac Low Salt Diet and 1.5 lit/day fluid restriction.   On your next visit with your primary care physician please Get Medicines reviewed and adjusted.   Please request your Prim.MD to go over all Hospital Tests and Procedure/Radiological results at the follow up, please get all Hospital records sent to your Prim MD by signing hospital release before you go home.   If you experience worsening of your admission symptoms, develop shortness of breath, life threatening emergency, suicidal or homicidal thoughts you must seek medical attention immediately by calling 911 or calling your MD immediately  if symptoms less severe.  You Must read complete instructions/literature along with all the possible adverse reactions/side effects for all the Medicines you take and that have been prescribed to you. Take any new Medicines after you have completely understood and accpet all the possible adverse reactions/side effects.   Do not drive, operating heavy machinery, perform activities at heights, swimming or participation in water activities or provide baby sitting services if your were admitted for syncope or siezures until you have seen by Primary MD or a Neurologist and advised to do so again.  Do not drive when taking Pain medications.    Do not take more than prescribed Pain, Sleep and Anxiety Medications  Special Instructions: If you have smoked or chewed Tobacco  in the last 2 yrs please stop smoking, stop any regular Alcohol  and or any Recreational drug use.  Wear Seat belts while  driving.   Please note  You were cared for by a hospitalist during your hospital stay. If you have any questions about your discharge medications or the care you received while you were in the hospital after you are discharged, you can call the unit and asked to speak with the hospitalist on call if the hospitalist that took care of you is not available. Once you are discharged, your primary care physician will handle any further medical issues. Please note that NO REFILLS for any discharge medications will be authorized once you are discharged, as it is imperative that  you return to your primary care physician (or establish a relationship with a primary care physician if you do not have one) for your aftercare needs so that they can reassess your need for medications and monitor your lab values.            Medication List    TAKE these medications        multivitamin with minerals Tabs tablet  Take 1 tablet by mouth daily.     oxyCODONE 5 MG immediate release tablet  Commonly known as:  Oxy IR/ROXICODONE  Take 1-2 tablets (5-10 mg total) by mouth every 6 (six) hours as needed for moderate pain or severe pain.     sulfamethoxazole-trimethoprim 800-160 MG tablet  Commonly known as:  BACTRIM DS,SEPTRA DS  Take 2 tablets by mouth 2 (two) times daily. Please take for 10 days and then stop  Start taking on:  01/18/2015          Diet and Activity recommendation: See Discharge Instructions above   Consults obtained - Orthopedic  Major procedures and Radiology Reports - PLEASE review detailed and final reports for all details, in brief -  Right foot abscess incision and drainage on 11/24    Dg Chest 2 View  01/14/2015  CLINICAL DATA:  Chest pain for 1 week. Sudden onset right foot swelling. No injury. EXAM: CHEST  2 VIEW COMPARISON:  None. FINDINGS: The heart size and mediastinal contours are within normal limits. Both lungs are clear. The visualized skeletal structures are  unremarkable. IMPRESSION: No active cardiopulmonary disease. Electronically Signed   By: Burman Nieves M.D.   On: 01/14/2015 00:36   Dg Foot Complete Right  01/14/2015  CLINICAL DATA:  Acute onset of right foot swelling and oozing between the toes. Initial encounter. EXAM: RIGHT FOOT COMPLETE - 3+ VIEW COMPARISON:  None. FINDINGS: There is no evidence of fracture or dislocation. The joint spaces are preserved. There is no evidence of talar subluxation; the subtalar joint is unremarkable in appearance. Dorsal soft tissue swelling is noted at the forefoot, with suggestion of a 5 mm metallic needle fragment embedded 5 mm deep to the skin surface at the dorsal soft tissues, projecting between the first and second metatarsals. IMPRESSION: 1. No evidence of fracture or dislocation. 2. Dorsal soft tissue swelling, with suggestion of a 5 mm metallic needle fragment embedded 5 mm deep to the skin surface at the dorsal soft tissues, projecting between the first and second metatarsals. Electronically Signed   By: Roanna Raider M.D.   On: 01/14/2015 00:36    Micro Results     Recent Results (from the past 240 hour(s))  Blood culture (routine x 2)     Status: None (Preliminary result)   Collection Time: 01/13/15 11:30 PM  Result Value Ref Range Status   Specimen Description BLOOD RIGHT ANTECUBITAL  Final   Special Requests   Final    BOTTLES DRAWN AEROBIC AND ANAEROBIC AER 10cc ANA 10cc   Culture   Final    NO GROWTH 3 DAYS Performed at [facility]    Report Status PENDING  Incomplete  Blood culture (routine x 2)     Status: None (Preliminary result)   Collection Time: 01/14/15 12:05 AM  Result Value Ref Range Status   Specimen Description BLOOD LEFT HAND  Final   Special Requests   Final    BOTTLES DRAWN AEROBIC AND ANAEROBIC AER 10cc ANA 10cc   Culture   Final    NO  GROWTH 3 DAYS Performed at [facility]    Report Status PENDING  Incomplete  Tissue culture     Status: None     Collection Time: 01/14/15  1:31 PM  Result Value Ref Range Status   Specimen Description TISSUE FOOT RIGHT  Final   Special Requests NONE  Final   Gram Stain   Final    MODERATE WBC PRESENT,BOTH PMN AND MONONUCLEAR RARE GRAM POSITIVE COCCI IN CLUSTERS Performed at [facility]    Culture   Final    ABUNDANT METHICILLIN RESISTANT STAPHYLOCOCCUS AUREUS Note: RIFAMPIN AND GENTAMICIN SHOULD NOT BE USED AS SINGLE DRUGS FOR TREATMENT OF STAPH INFECTIONS. CRITICAL RESULT CALLED TO, READ BACK BY AND VERIFIED WITH:  JENNY SMITH @11 /27/16 @0802  BY KENDR Performed at [facility]    Report Status 01/17/2015 FINAL  Final   Organism ID, Bacteria METHICILLIN RESISTANT STAPHYLOCOCCUS AUREUS  Final      Susceptibility   Methicillin resistant staphylococcus aureus - MIC*    CLINDAMYCIN <=0.25 SENSITIVE Sensitive     ERYTHROMYCIN 0.5 SENSITIVE Sensitive     GENTAMICIN <=0.5 SENSITIVE Sensitive     LEVOFLOXACIN 4 INTERMEDIATE Intermediate     OXACILLIN >=4 RESISTANT Resistant     RIFAMPIN <=0.5 SENSITIVE Sensitive     TRIMETH/SULFA <=10 SENSITIVE Sensitive     VANCOMYCIN 1 SENSITIVE Sensitive     TETRACYCLINE <=1 SENSITIVE Sensitive     * ABUNDANT METHICILLIN RESISTANT STAPHYLOCOCCUS AUREUS  Anaerobic culture     Status: None (Preliminary result)   Collection Time: 01/14/15  1:32 PM  Result Value Ref Range Status   Specimen Description TISSUE FOOT RIGHT  Final   Special Requests NONE  Final   Gram Stain PENDING  Incomplete   Culture   Final    NO ANAEROBES ISOLATED; CULTURE IN PROGRESS FOR 5 DAYS Performed at [facility]    Report Status PENDING  Incomplete       Today   Subjective:   [name] ReadingJames [name] today has no headache,no chest abdominal pain,no new weakness tingling or numbness, feels much bette today.  Objective:   Blood pressure 118/73, pulse 77, temperature 98.1 F (36.7 C), temperature source Oral, resp. rate 19, height 5\' 10"  (1.778 m), weight  79.379 kg (175 lb), SpO2 98 %.   Intake/Output Summary (Last 24 hours) at 01/17/15 1433 Last data filed at 01/17/15 1300  Gross per 24 hour  Intake    360 ml  Output      0 ml  Net    360 ml    Exam Awake Alert, Oriented x 3, No new F.N deficits, Normal affect [place].AT,PERRAL Supple Neck,No JVD, No cervical lymphadenopathy appriciated.  Symmetrical Chest wall movement, Good air movement bilaterally, CTAB RRR,No Gallops,Rubs or new Murmurs, No Parasternal Heave +ve B.Sounds, Abd Soft, Non tender, No organomegaly appriciated, No rebound -guarding or rigidity. No Cyanosis, Clubbing or edema, No new Rash or bruise, right foot bandaged.  Data Review   CBC w Diff: Lab Results  Component Value Date   WBC 5.7 01/17/2015   HGB 13.0 01/17/2015   HCT 39.4 01/17/2015   PLT 180 01/17/2015   LYMPHOPCT 21 01/13/2015   MONOPCT 10 01/13/2015   EOSPCT 1 01/13/2015   BASOPCT 0 01/13/2015    CMP: Lab Results  Component Value Date   NA 138 01/17/2015   K 4.0 01/17/2015   CL 106 01/17/2015   CO2 26 01/17/2015   BUN 12 01/17/2015   CREATININE 1.11  01/17/2015  .   Total Time in preparing paper work, data evaluation and todays exam - 35 minutes  ELGERGAWY, DAWOOD M.D on 01/17/2015 at 2:33 PM  [facility]   Office  [phone]

## 2015-01-17 NOTE — Progress Notes (Signed)
Subjective: 3 Days Post-Op Procedure(s) (LRB): IRRIGATION AND DEBRIDEMENT EXTREMITY (Right) REMOVAL FOREIGN BODY EXTREMITY (Right) Patient reports pain as minimal to right foot.  Tolertaing PO's well. Denies F/c. Denies CP, SOB, or calf pain.   Objective: Vital signs in last 24 hours: Temp:  [98.2 F (36.8 C)-98.7 F (37.1 C)] 98.7 F (37.1 C) (11/27 0559) Pulse Rate:  [61-78] 61 (11/27 0559) Resp:  [19] 19 (11/27 0559) BP: (113-128)/(62-73) 119/66 mmHg (11/27 0559) SpO2:  [98 %-99 %] 98 % (11/27 0559)  Intake/Output from previous day: 11/26 0701 - 11/27 0700 In: 240 [P.O.:240] Out: 800 [Urine:800] Intake/Output this shift:     Recent Labs  01/15/15 0232 01/17/15 0222  HGB 12.9* 13.0    Recent Labs  01/15/15 0232 01/17/15 0222  WBC 10.1 5.7  RBC 4.21* 4.37  HCT 38.3* 39.4  PLT 150 180    Recent Labs  01/15/15 0232 01/17/15 0222  NA 136 138  K 4.0 4.0  CL 106 106  CO2 24 26  BUN 10 12  CREATININE 1.33* 1.11  GLUCOSE 118* 101*  CALCIUM 7.9* 8.5*   No results for input(s): LABPT, INR in the last 72 hours.  Well nourished. Alert and oriented x3. RRR, Lungs clear, BS x4. Abdomen soft and non tender. Right Calf soft and non tender. Right knee dressing C/D/I. No DVT signs. Compartment soft. No signs of infection.  Right LE grossly neurovascular intact.  Assessment/Plan: 3 Days Post-Op Procedure(s) (LRB): IRRIGATION AND DEBRIDEMENT EXTREMITY (Right) REMOVAL FOREIGN BODY EXTREMITY (Right) WBAT through the heel in post-op shoe PT eval prior to D/c Plan to D/C home per medicine Cultures positive for MRSA, medicine recommending PO ABX  F/u with Dr.Hewitt next week in office Petar Mucci L 01/17/2015, 9:18 AM

## 2015-01-17 NOTE — Progress Notes (Signed)
Orthopedic Tech Progress Note Patient Details:  Mauri ReadingJames Amador [birth year] [id number]  Ortho Devices Type of Ortho Device: Crutches Ortho Device/Splint Interventions: Application   Saul FordyceJennifer C Shamiya Demeritt 01/17/2015, 12:30 PM

## 2015-01-18 ENCOUNTER — Encounter (HOSPITAL_COMMUNITY): Payer: Self-pay | Admitting: Orthopedic Surgery

## 2015-01-18 LAB — HEMOGLOBIN A1C
HEMOGLOBIN A1C: 5.7 % — AB (ref 4.8–5.6)
MEAN PLASMA GLUCOSE: 117 mg/dL

## 2015-01-19 LAB — CULTURE, BLOOD (ROUTINE X 2)
Culture: NO GROWTH
Culture: NO GROWTH

## 2015-01-19 LAB — ANAEROBIC CULTURE

## 2015-01-21 ENCOUNTER — Ambulatory Visit (INDEPENDENT_AMBULATORY_CARE_PROVIDER_SITE_OTHER): Payer: Self-pay | Admitting: Family Medicine

## 2015-01-21 ENCOUNTER — Encounter: Payer: Self-pay | Admitting: Family Medicine

## 2015-01-21 VITALS — BP 119/66 | HR 93 | Temp 98.2°F | Ht 70.0 in | Wt 181.0 lb

## 2015-01-21 DIAGNOSIS — Z7689 Persons encountering health services in other specified circumstances: Secondary | ICD-10-CM

## 2015-01-21 DIAGNOSIS — Z23 Encounter for immunization: Secondary | ICD-10-CM

## 2015-01-21 DIAGNOSIS — Z0000 Encounter for general adult medical examination without abnormal findings: Secondary | ICD-10-CM

## 2015-01-21 MED ORDER — TETANUS-DIPHTH-ACELL PERTUSSIS 5-2.5-18.5 LF-MCG/0.5 IM SUSP: 0.5000 mL | Freq: Once | INTRAMUSCULAR | Status: DC

## 2015-01-21 NOTE — Progress Notes (Signed)
Patient ID: [name] [name], male   DOB: 1975/05/16, 39 y.o.   MRN: [id number]   [name] [name], is a 39 y.o. male  NWG:[id number]  [id number]  DOB - 1975/05/16  CC:  Chief Complaint  Patient presents with  . New Evaluation  . Hospitalization Follow-up       HPI: [name] [name] is a 39 y.o. male here to establish care. He was recently seen in hospital with chest and foot pain. A cardiac origin was ruled out for the chest pain. He was found to have a foreign body in his foot with resultant infection and cellulitis. He has surgical removal of the foreign body and as discharges on Septra DS. He was told to follow-up with his surgeon and was told to follow-up here to establish primary care.  His medical history is unremarkable. He denies any chronic illness and prior to this episode was on no chronic medications. 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 He has not yet follow-up up with the surgeon. He reports changing his dressing this morning and there was no purulence, only some bloody drainage. He reports a significant decrease in redness. He is to call orthopedic surgeon this afternoon for follow-up. He smokes 1 1/2 packs of cigarettes a day, uses marijuana and cocaine. He reports drinking 3 cans of beer weekly. He does not know when he had a tetanus shot last. He declines influenza vaccine.  No Known Allergies Past Medical History  Diagnosis Date  . Shortness of breath dyspnea    Current Outpatient Prescriptions on File Prior to Visit  Medication Sig Dispense Refill  . Multiple Vitamin (MULTIVITAMIN WITH MINERALS) TABS tablet Take 1 tablet by mouth daily.    Marland Kitchen. oxyCODONE (OXY IR/ROXICODONE) 5 MG immediate release tablet Take 1-2 tablets (5-10 mg total) by mouth every 6 (six) hours as needed for moderate pain or severe pain. 12 tablet 0  . sulfamethoxazole-trimethoprim (BACTRIM DS,SEPTRA DS) 800-160 MG tablet Take 2 tablets by mouth 2 (two) times daily. Please take for 10 days and then stop 40  tablet 0   No current facility-administered medications on file prior to visit.   No family history on file. Social History   Social History  . Marital Status: Single    Spouse Name: N/A  . Number of Children: N/A  . Years of Education: N/A   Occupational History  . Not on file.   Social History Main Topics  . Smoking status: Current Every Day Smoker -- 1.50 packs/day for 25 years    Types: Cigarettes  . Smokeless tobacco: Never Used  . Alcohol Use: 1.8 oz/week    3 Cans of beer, 0 Glasses of wine per week  . Drug Use: Yes    Special: Marijuana, Cocaine  . Sexual Activity: Not on file   Other Topics Concern  . Not on file   Social History Narrative    Review of Systems: Constitutional: Negative for fever, chills, appetite change, weight loss,  Fatigue. Skin: Negative for rashes or lesions of concern. HENT: Negative for ear pain, ear discharge.nose bleeds Eyes: Negative for pain, discharge, redness, itching and visual disturbance. Neck: Negative for pain, stiffness Respiratory: Negative for cough, shortness of breath,   Cardiovascular: Negative for chest pain, palpitations and leg swelling. Gastrointestinal: Negative for abdominal pain, nausea, vomiting, diarrhea, constipations Genitourinary: Negative for dysuria, urgency, frequency, hematuria,  Musculoskeletal: Negative for back pain, joint pain, joint  swelling, and gait problem.Negative for weakness. Neurological: Negative for dizziness, tremors, seizures, syncope,  light-headedness, numbness and headaches.  Hematological: Negative for easy bruising or bleeding Psychiatric/Behavioral: Negative for depression, anxiety, decreased concentration, confusion   Objective:   Filed Vitals:   01/21/15 1342  BP: 119/66  Pulse: 93  Temp: 98.2 F (36.8 C)    Physical Exam: Constitutional: Patient appears well-developed and well-nourished. No distress. HENT: Normocephalic, atraumatic, External right and left ear  normal. Oropharynx is clear and moist.  Eyes: Conjunctivae and EOM are normal. PERRLA, no scleral icterus. Neck: Normal ROM. Neck supple. No lymphadenopathy, No thyromegaly. CVS: RRR, S1/S2 +, no murmurs, no gallops, no rubs Pulmonary: Effort and breath sounds normal, no stridor, rhonchi, wheezes, rales.  Abdominal: Soft. Normoactive BS,, no distension, tenderness, rebound or guarding.  Musculoskeletal: Normal range of motion. No edema and no tenderness.  Neuro: Alert.Normal muscle tone coordination. Non-focal Skin: Skin is warm and dry. No rash noted. Not diaphoretic. No erythema. No pallor. Psychiatric: Normal mood and affect. Behavior, judgment, thought content normal.  Lab Results  Component Value Date   WBC 5.7 01/17/2015   HGB 13.0 01/17/2015   HCT 39.4 01/17/2015   MCV 90.2 01/17/2015   PLT 180 01/17/2015   Lab Results  Component Value Date   CREATININE 1.11 01/17/2015   BUN 12 01/17/2015   NA 138 01/17/2015   K 4.0 01/17/2015   CL 106 01/17/2015   CO2 26 01/17/2015    Lab Results  Component Value Date   HGBA1C 5.7* 01/16/2015   Lipid Panel  No results found for: CHOL, TRIG, HDL, CHOLHDL, VLDL, LDLCALC     Assessment and plan:   1. Health care maintenance  - COMPLETE METABOLIC PANEL WITH GFR; Future - CBC with Differential; Future - Lipid panel; Future  2. Encounter to establish care  3. Need for vaccination  - Tdap (BOOSTRIX) injection 0.5 mL; Inject 0.5 mLs into the muscle once.   Return in about 2 weeks (around 02/04/2015).  The patient was given clear instructions to go to ER or return to medical center if symptoms don't improve, worsen or new problems develop. The patient verbalized understanding.    Henrietta Hoover FNP  01/25/2015, 7:40 AM

## 2015-01-21 NOTE — Patient Instructions (Signed)
Come in fasting on December 16 for fasting bloodwork. Call and make a appointment with orthopedist and directed in hospital.

## 2015-02-05 ENCOUNTER — Other Ambulatory Visit (INDEPENDENT_AMBULATORY_CARE_PROVIDER_SITE_OTHER): Payer: Self-pay

## 2015-02-05 DIAGNOSIS — Z0000 Encounter for general adult medical examination without abnormal findings: Secondary | ICD-10-CM

## 2015-02-05 LAB — COMPLETE METABOLIC PANEL WITH GFR
ALBUMIN: 4.2 g/dL (ref 3.6–5.1)
ALK PHOS: 63 U/L (ref 40–115)
ALT: 8 U/L — ABNORMAL LOW (ref 9–46)
AST: 14 U/L (ref 10–40)
BUN: 8 mg/dL (ref 7–25)
CHLORIDE: 103 mmol/L (ref 98–110)
CO2: 27 mmol/L (ref 20–31)
Calcium: 9.3 mg/dL (ref 8.6–10.3)
Creat: 1.01 mg/dL (ref 0.60–1.35)
GFR, Est African American: >89 mL/min (ref >=60)
GLUCOSE: 76 mg/dL (ref 65–99)
POTASSIUM: 4.4 mmol/L (ref 3.5–5.3)
SODIUM: 139 mmol/L (ref 135–146)
Total Bilirubin: 0.6 mg/dL (ref 0.2–1.2)
Total Protein: 7.4 g/dL (ref 6.1–8.1)

## 2015-02-05 LAB — CBC WITH DIFFERENTIAL/PLATELET
BASOS PCT: 0 % (ref 0–1)
Basophils Absolute: 0 10*3/uL (ref 0.0–0.1)
Eosinophils Absolute: 0 10*3/uL (ref 0.0–0.7)
Eosinophils Relative: 1 % (ref 0–5)
HEMATOCRIT: 46 % (ref 39.0–52.0)
HEMOGLOBIN: 15.8 g/dL (ref 13.0–17.0)
LYMPHS PCT: 54 % — AB (ref 12–46)
Lymphs Abs: 2.5 10*3/uL (ref 0.7–4.0)
MCH: 30 pg (ref 26.0–34.0)
MCHC: 34.3 g/dL (ref 30.0–36.0)
MCV: 87.5 fL (ref 78.0–100.0)
MONO ABS: 0.3 10*3/uL (ref 0.1–1.0)
MONOS PCT: 7 % (ref 3–12)
MPV: 10.1 fL (ref 8.6–12.4)
NEUTROS ABS: 1.8 10*3/uL (ref 1.7–7.7)
NEUTROS PCT: 38 % — AB (ref 43–77)
Platelets: 223 10*3/uL (ref 150–400)
RBC: 5.26 MIL/uL (ref 4.22–5.81)
RDW: 13.5 % (ref 11.5–15.5)
WBC: 4.7 10*3/uL (ref 4.0–10.5)

## 2015-02-05 LAB — LIPID PANEL
CHOL/HDL RATIO: 2.1 ratio (ref <=5.0)
CHOLESTEROL: 118 mg/dL — AB (ref 125–200)
HDL: 56 mg/dL (ref >=40)
LDL CALC: 41 mg/dL (ref <130)
TRIGLYCERIDES: 107 mg/dL (ref <150)
VLDL: 21 mg/dL (ref <30)

## 2015-10-01 ENCOUNTER — Other Ambulatory Visit: Payer: Self-pay

## 2015-10-01 ENCOUNTER — Emergency Department (HOSPITAL_BASED_OUTPATIENT_CLINIC_OR_DEPARTMENT_OTHER)
Admission: EM | Admit: 2015-10-01 | Discharge: 2015-10-01 | Disposition: A | Discharge status: Home / Self Care | Payer: Self-pay | Attending: Emergency Medicine | Admitting: Emergency Medicine

## 2015-10-01 ENCOUNTER — Encounter (HOSPITAL_BASED_OUTPATIENT_CLINIC_OR_DEPARTMENT_OTHER): Payer: Self-pay

## 2015-10-01 ENCOUNTER — Emergency Department (HOSPITAL_BASED_OUTPATIENT_CLINIC_OR_DEPARTMENT_OTHER): Payer: Self-pay

## 2015-10-01 DIAGNOSIS — K0889 Other specified disorders of teeth and supporting structures: Secondary | ICD-10-CM

## 2015-10-01 DIAGNOSIS — F17210 Nicotine dependence, cigarettes, uncomplicated: Secondary | ICD-10-CM | POA: Insufficient documentation

## 2015-10-01 DIAGNOSIS — K047 Periapical abscess without sinus: Secondary | ICD-10-CM | POA: Insufficient documentation

## 2015-10-01 DIAGNOSIS — R0789 Other chest pain: Secondary | ICD-10-CM | POA: Insufficient documentation

## 2015-10-01 LAB — CBC WITH DIFFERENTIAL/PLATELET
BASOS ABS: 0 10*3/uL (ref 0.0–0.1)
BASOS PCT: 0 %
Eosinophils Absolute: 0.1 10*3/uL (ref 0.0–0.7)
Eosinophils Relative: 2 %
HEMATOCRIT: 38.7 % — AB (ref 39.0–52.0)
Hemoglobin: 12.7 g/dL — ABNORMAL LOW (ref 13.0–17.0)
Lymphocytes Relative: 46 %
Lymphs Abs: 2.3 10*3/uL (ref 0.7–4.0)
MCH: 30.3 pg (ref 26.0–34.0)
MCHC: 32.8 g/dL (ref 30.0–36.0)
MCV: 92.4 fL (ref 78.0–100.0)
MONO ABS: 0.4 10*3/uL (ref 0.1–1.0)
Monocytes Relative: 9 %
NEUTROS ABS: 2.1 10*3/uL (ref 1.7–7.7)
Neutrophils Relative %: 43 %
PLATELETS: 154 10*3/uL (ref 150–400)
RBC: 4.19 MIL/uL — AB (ref 4.22–5.81)
RDW: 13.5 % (ref 11.5–15.5)
WBC: 4.9 10*3/uL (ref 4.0–10.5)

## 2015-10-01 LAB — TROPONIN I
Troponin I: <0.03 ng/mL (ref <0.03)
Troponin I: <0.03 ng/mL (ref <0.03)

## 2015-10-01 LAB — BASIC METABOLIC PANEL
ANION GAP: 5 (ref 5–15)
BUN: 13 mg/dL (ref 6–20)
CHLORIDE: 109 mmol/L (ref 101–111)
CO2: 25 mmol/L (ref 22–32)
CREATININE: 0.89 mg/dL (ref 0.61–1.24)
Calcium: 8.6 mg/dL — ABNORMAL LOW (ref 8.9–10.3)
GFR calc non Af Amer: >60 mL/min (ref >60)
Glucose, Bld: 93 mg/dL (ref 65–99)
POTASSIUM: 3.6 mmol/L (ref 3.5–5.1)
Sodium: 139 mmol/L (ref 135–145)

## 2015-10-01 LAB — MAGNESIUM: MAGNESIUM: 1.9 mg/dL (ref 1.7–2.4)

## 2015-10-01 MED ORDER — PENICILLIN V POTASSIUM 500 MG PO TABS: 500.0000 mg | ORAL_TABLET | Freq: Four times a day (QID) | ORAL | 0 refills | Status: AC

## 2015-10-01 NOTE — ED Provider Notes (Signed)
[facility] Provider Note   CSN: [id number] Arrival date & time: 10/01/15  0007  First Provider Contact:  None       History   Chief Complaint Chief Complaint  Patient presents with  . Dental Pain  . Chest Pain    HPI [name] [name] is a 40 y.o. male.  HPI 40 year old who presents with chest pain and dental pain. He states that he has fractured tooth in the left lower jaw.  Has been having increasing pain and mild gingival swelling over the past few weeks.  No fevers no chills, no difficulty swallowing, sore throat, or difficulty breathing.  No significant facial swelling.  He also complains of chest discomfort starting yesterday evening while at rest. Feels like "double beating" and "gas bubble" in the chest that comes and goes. No known inciting factors. Not pleuritic. Only has dyspnea when he smokes. States he feels lightheaded sometimes when he bends over, but no syncope. No LE edema, orthopnea, PND, DOE. No cough, congestion, sore throat, runny nose. No history of PE/DVT, recent immobilization. Not associated with eating or position changes. Not worse with exertion or movement. Denies cocaine abuse over past 3-4 months.  Past Medical History:  Diagnosis Date  . Shortness of breath dyspnea     Patient Active Problem List   Diagnosis Date Noted  . Chest pain 01/14/2015  . Cellulitis of right foot 01/14/2015  . Pain in the chest     Past Surgical History:  Procedure Laterality Date  . FOREIGN BODY REMOVAL Right 01/14/2015   Procedure: REMOVAL FOREIGN BODY EXTREMITY;  Surgeon: Toni Arthurs, MD;  Location: [facility] OR;  Service: Orthopedics;  Laterality: Right;  . I&D EXTREMITY Right 01/14/2015   Procedure: IRRIGATION AND DEBRIDEMENT EXTREMITY;  Surgeon: Toni Arthurs, MD;  Location: [facility] OR;  Service: Orthopedics;  Laterality: Right;       Home Medications    Prior to Admission medications   Medication Sig Start Date End Date Taking? Authorizing Provider    Multiple Vitamin (MULTIVITAMIN WITH MINERALS) TABS tablet Take 1 tablet by mouth daily.    Historical Provider, MD  oxyCODONE (OXY IR/ROXICODONE) 5 MG immediate release tablet Take 1-2 tablets (5-10 mg total) by mouth every 6 (six) hours as needed for moderate pain or severe pain. 01/17/15   Leana Roe Elgergawy, MD  penicillin v potassium (VEETID) 500 MG tablet Take 1 tablet (500 mg total) by mouth 4 (four) times daily. 10/01/15 10/08/15  Lavera Guise, MD    Family History No family history on file.  Social History Social History  Substance Use Topics  . Smoking status: Current Every Day Smoker    Packs/day: 1.50    Years: 25.00    Types: Cigarettes  . Smokeless tobacco: Never Used  . Alcohol use 1.8 oz/week    3 Cans of beer per week     Allergies   Review of patient's allergies indicates no known allergies.   Review of Systems Review of Systems 10/14 systems reviewed and are negative other than those stated in the HPI   Physical Exam Updated Vital Signs BP 125/82   Pulse 65   Temp 98.1 F (36.7 C) (Oral)   Resp 21   Ht  (1.753 m)   Wt 180 lb (81.6 kg)   SpO2 100%   BMI 26.58 kg/m   Physical Exam Physical Exam  Nursing note and vitals reviewed. Constitutional: Well developed, well nourished, non-toxic, and in no acute distress Head:  Normocephalic and atraumatic.  Mouth/Throat: Oropharynx is clear and moist. Fractured tooth 19 w/ exposed dentin. Mild gingival erythema w/o significant swelling. Neck: Normal range of motion. Neck supple.  Cardiovascular: Normal rate and regular rhythm.   Pulmonary/Chest: Effort normal and breath sounds normal.  Abdominal: Soft. There is no tenderness. There is no rebound and no guarding.  Musculoskeletal: Normal range of motion.  Neurological: Alert, no facial droop, fluent speech, moves all extremities symmetrically Skin: Skin is warm and dry.  Psychiatric: Cooperative   ED Treatments / Results  Labs (all labs ordered  are listed, but only abnormal results are displayed) Labs Reviewed  CBC WITH DIFFERENTIAL/PLATELET - Abnormal; Notable for the following:       Result Value   RBC 4.19 (*)    Hemoglobin 12.7 (*)    HCT 38.7 (*)    All other components within normal limits  BASIC METABOLIC PANEL - Abnormal; Notable for the following:    Calcium 8.6 (*)    All other components within normal limits  TROPONIN I  MAGNESIUM  TROPONIN I    EKG  EKG Interpretation None       Radiology Dg Chest 2 View  Result Date: 10/01/2015 CLINICAL DATA:  Acute onset of left-sided chest pain and shortness of breath. Left upper extremity numbness. Initial encounter. EXAM: CHEST  2 VIEW COMPARISON:  Chest radiograph performed 01/14/2015 FINDINGS: The lungs are well-aerated and clear. There is no evidence of focal opacification, pleural effusion or pneumothorax. The heart is normal in size; the mediastinal contour is within normal limits. No acute osseous abnormalities are seen. IMPRESSION: No acute cardiopulmonary process seen. Electronically Signed   By: Roanna RaiderJeffery  Chang M.D.   On: 10/01/2015 02:54    Procedures Procedures (including critical care time)  Medications Ordered in ED Medications - No data to display   Initial Impression / Assessment and Plan / ED Course  I have reviewed the triage vital signs and the nursing notes.  Pertinent labs & imaging results that were available during my care of the patient were reviewed by me and considered in my medical decision making (see chart for details).  Clinical Course    Presenting with dental pain and atypical chest pain.  Fractured tooth 19 without facial swelling, oropharyngeal swelling, or drainable abscess. Suspect periapical abscess. Will treat with PCN and given dental resources.  Chest pain atypical. EKG without ischemic changes with delta wave in lateral leads and shortened PR raising suspicion for WPW. No history of syncope. Prior EKGs has also similar  delta wave. Discussed outpatient cardiology follow-up with patient and he expressed understanding. He is low risk ACS. Pain not typical for dissection or PE. Serial troponin negative, EKG w/o dynamic changes, and CXR w/o acute cardiopulmonary processes. The patient appears reasonably screened and/or stabilized for discharge and I doubt any other medical condition or other [facility] requiring further screening, evaluation, or treatment in the ED at this time prior to discharge. Strict return and follow-up instructions reviewed. He expressed understanding of all discharge instructions and felt comfortable with the plan of care.   Final Clinical Impressions(s) / ED Diagnoses   Final diagnoses:  Pain, dental  Atypical chest pain  Dental abscess    New Prescriptions Discharge Medication List as of 10/01/2015  6:18 AM    START taking these medications   Details  penicillin v potassium (VEETID) 500 MG tablet Take 1 tablet (500 mg total) by mouth 4 (four) times daily., Starting Fri 10/01/2015, Until Fri 10/08/2015,  Print         Lavera Guise, MD 10/01/15 [phone]

## 2015-10-01 NOTE — ED Notes (Signed)
Pt verbalizes understanding of d/c instructions and denies any further needs at this time. 

## 2015-10-01 NOTE — ED Triage Notes (Signed)
Left bottom tooth pain for the last several days, now stating that he is having left side chest "bubbling," since last night.

## 2015-10-01 NOTE — Discharge Instructions (Signed)
Please follow-up with dentist for further management of your dental pain. Please take antibiotics for dental pain in meantime.  You are given follow-up for cardiology as well. Your EKG shows that you may have abnormality in heart conduction called Wolff-Parkinson-White (WPW) syndrome. Please follow-up with cardiology for further w/u and management.  Return without fail for worsening symptoms, including worsening pain, difficulty breathing, fever, or any other symptoms concerning to you.

## 2015-10-01 NOTE — ED Notes (Signed)
Patient transported to X-ray 

## 2017-01-07 IMAGING — CR DG CHEST 2V
2 series · 2 of 2 positions shown · non-contrast
Comparison: Chest radiograph performed 01/14/2015

CLINICAL DATA: Acute onset of left-sided chest pain and shortness
of breath. Left upper extremity numbness. Initial encounter.

EXAM:
CHEST  2 VIEW

[w chest pa]
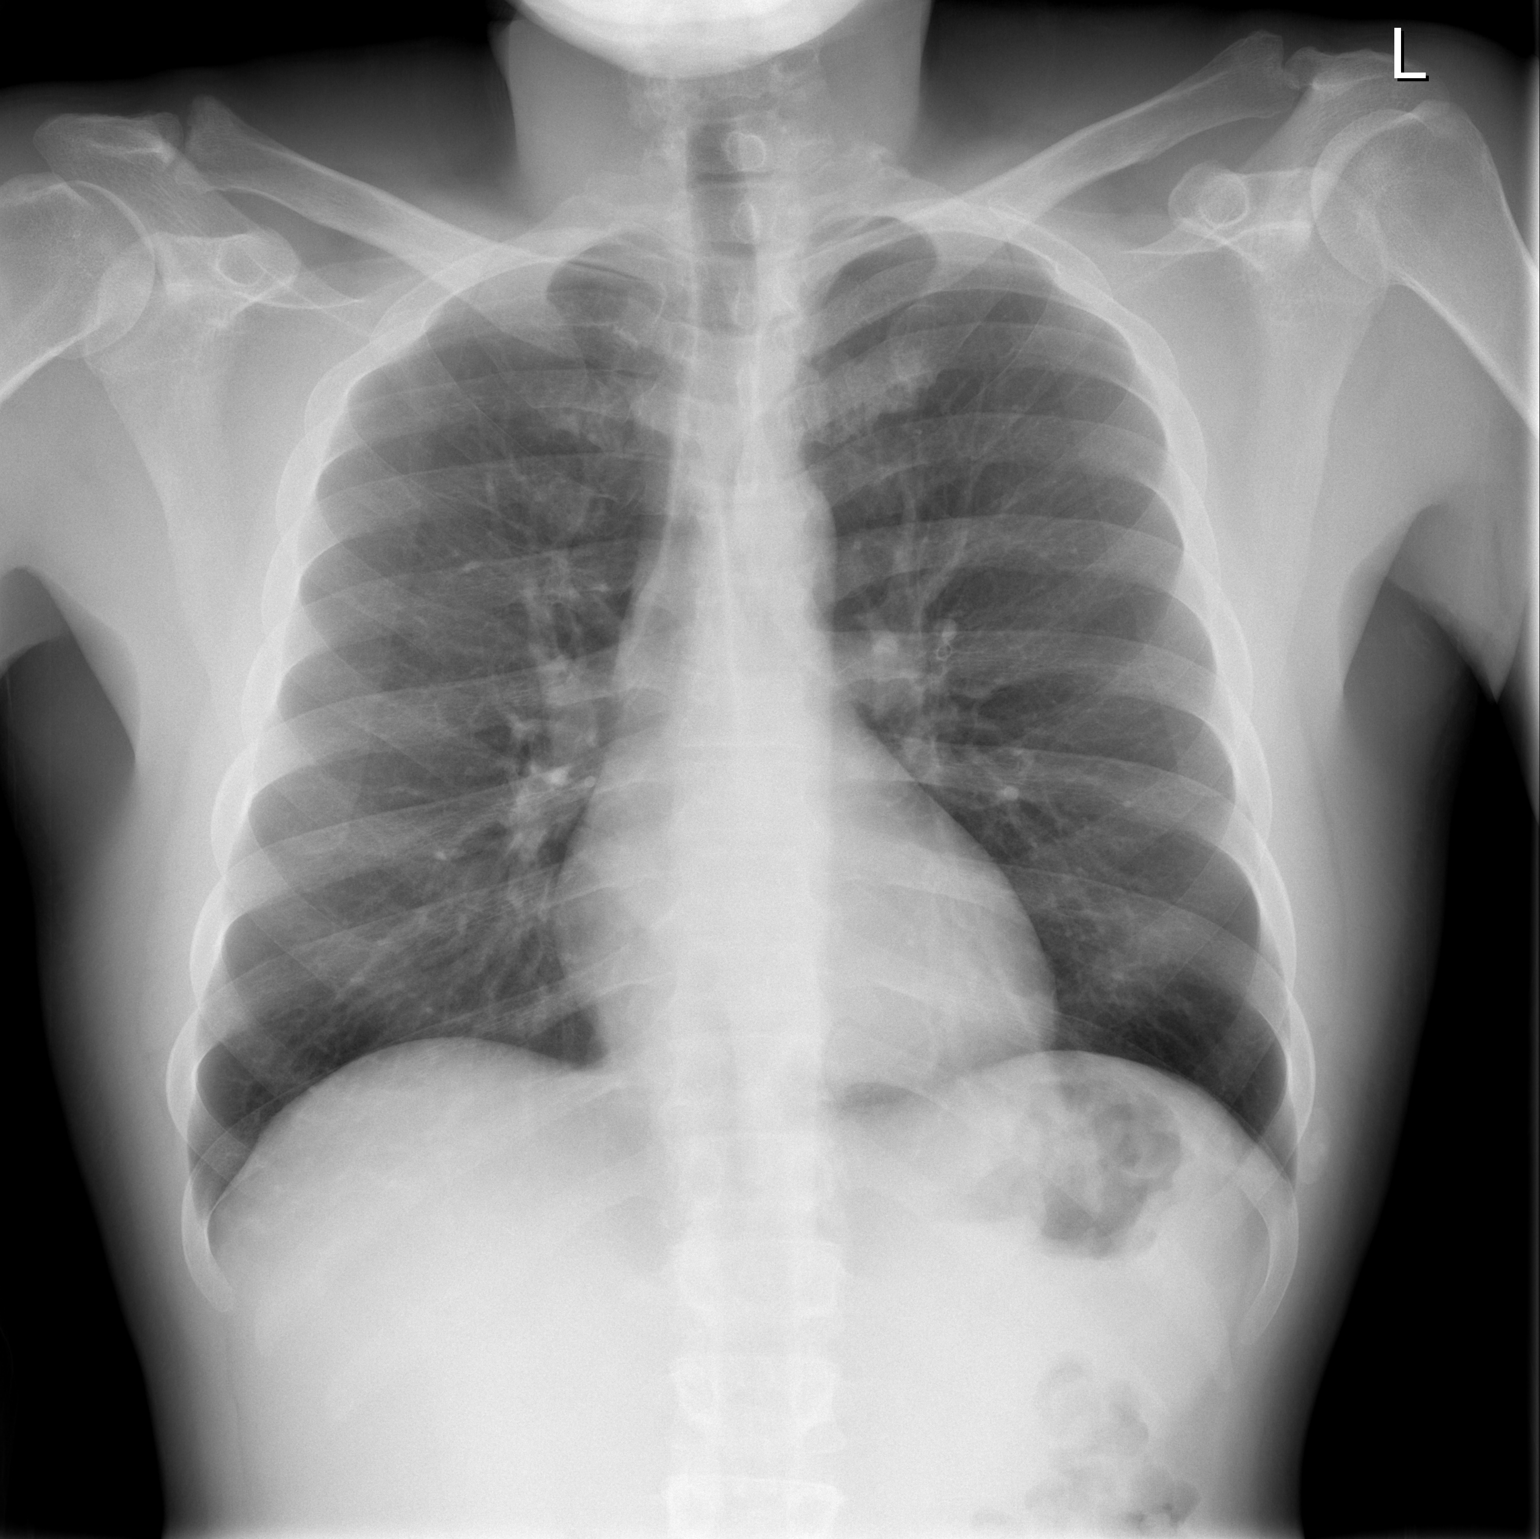

[w chest lat]
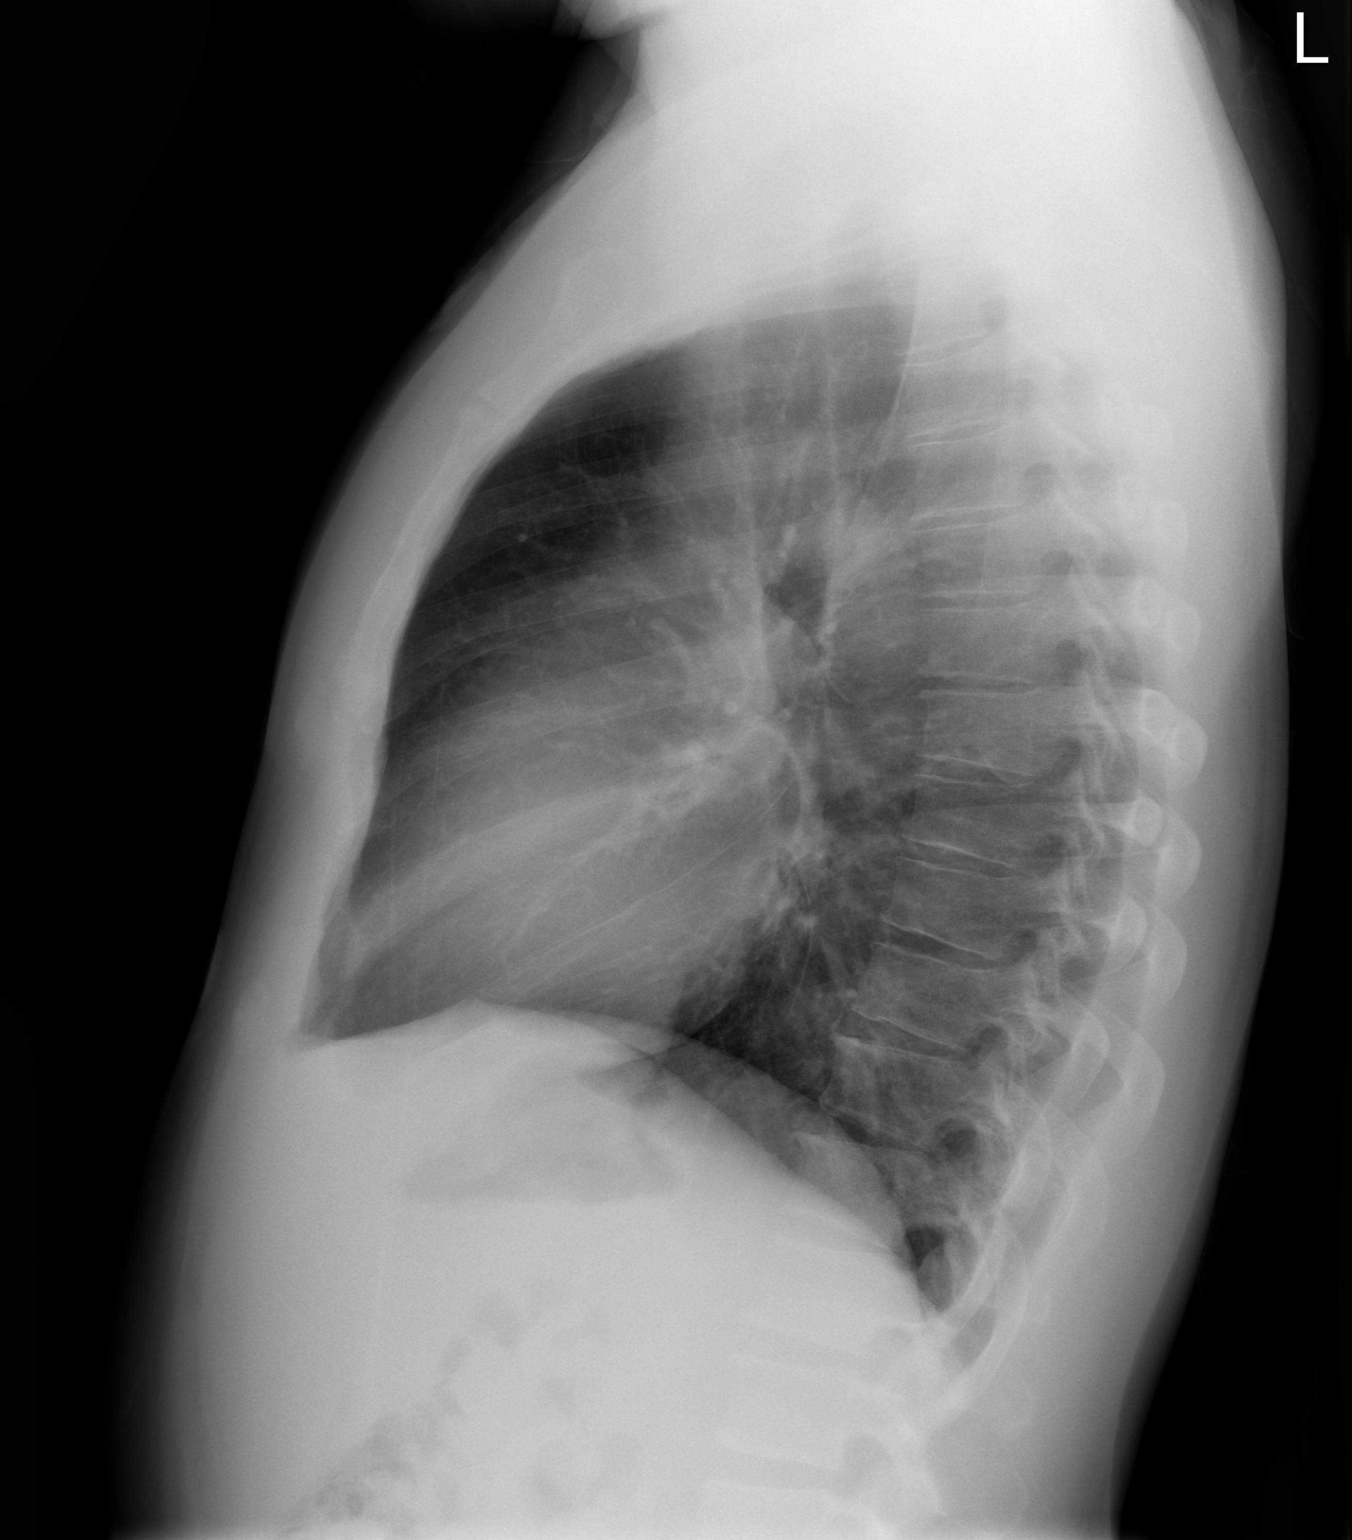

[2 of 2 positions shown; findings below may reference images not displayed]

FINDINGS: The lungs are well-aerated and clear. There is no evidence of focal
opacification, pleural effusion or pneumothorax.

The heart is normal in size; the mediastinal contour is within
normal limits. No acute osseous abnormalities are seen.
IMPRESSION: No acute cardiopulmonary process seen.

## 2020-03-09 ENCOUNTER — Other Ambulatory Visit: Payer: Self-pay

## 2020-03-09 ENCOUNTER — Encounter (HOSPITAL_BASED_OUTPATIENT_CLINIC_OR_DEPARTMENT_OTHER): Payer: Self-pay

## 2020-03-09 ENCOUNTER — Emergency Department (HOSPITAL_BASED_OUTPATIENT_CLINIC_OR_DEPARTMENT_OTHER)
Admission: EM | Admit: 2020-03-09 | Discharge: 2020-03-09 | Disposition: A | Discharge status: Home / Self Care | Payer: Self-pay | Attending: Emergency Medicine | Admitting: Emergency Medicine

## 2020-03-09 DIAGNOSIS — K047 Periapical abscess without sinus: Secondary | ICD-10-CM | POA: Insufficient documentation

## 2020-03-09 DIAGNOSIS — K0889 Other specified disorders of teeth and supporting structures: Secondary | ICD-10-CM | POA: Insufficient documentation

## 2020-03-09 DIAGNOSIS — Z79899 Other long term (current) drug therapy: Secondary | ICD-10-CM | POA: Insufficient documentation

## 2020-03-09 DIAGNOSIS — F17210 Nicotine dependence, cigarettes, uncomplicated: Secondary | ICD-10-CM | POA: Insufficient documentation

## 2020-03-09 MED ORDER — AMOXICILLIN-POT CLAVULANATE 875-125 MG PO TABS: 1.0000 | ORAL_TABLET | Freq: Two times a day (BID) | ORAL | 0 refills | Status: AC

## 2020-03-09 NOTE — ED Triage Notes (Signed)
Pt c/o left lower dental pain x 1 week-NAD-steady gait

## 2020-03-09 NOTE — ED Provider Notes (Signed)
[facility] EMERGENCY DEPARTMENT Provider Note   CSN: [id number] Arrival date & time: 03/09/20  1946     History Chief Complaint  Patient presents with  . Dental Pain    [name] [name] is a 45 y.o. male.  HPI Patient is a 45 year old male with 1 week of left lower dental pain he states it is achy constant worse with touching the decaying teeth present there.  He states that he has seen a dentist approximately a year ago and was told that he needed to be seen again for his teeth however did not return.  He denies any fevers, chills, nausea, vomiting, difficulty swallowing or chewing.  Denies any difficulty moving his tongue and changes in his voice and denies any sore throat.  No other associate symptoms.  No aggravating factors.  He has taken no medications prior to arrival in ER.     Past Medical History:  Diagnosis Date  . Shortness of breath dyspnea     Patient Active Problem List   Diagnosis Date Noted  . Chest pain 01/14/2015  . Cellulitis of right foot 01/14/2015  . Pain in the chest     Past Surgical History:  Procedure Laterality Date  . FOREIGN BODY REMOVAL Right 01/14/2015   Procedure: REMOVAL FOREIGN BODY EXTREMITY;  Surgeon: Toni Arthurs, MD;  Location: [facility] OR;  Service: Orthopedics;  Laterality: Right;  . I & D EXTREMITY Right 01/14/2015   Procedure: IRRIGATION AND DEBRIDEMENT EXTREMITY;  Surgeon: Toni Arthurs, MD;  Location: [facility] OR;  Service: Orthopedics;  Laterality: Right;       No family history on file.  Social History   Tobacco Use  . Smoking status: Current Every Day Smoker    Packs/day: 1.50    Years: 25.00    Pack years: 37.50    Types: Cigarettes  . Smokeless tobacco: Never Used  Substance Use Topics  . Alcohol use: Yes    Comment: occ  . Drug use: Yes    Types: Marijuana    Home Medications Prior to Admission medications   Medication Sig Start Date End Date Taking? Authorizing Provider  amoxicillin-clavulanate (AUGMENTIN)  875-125 MG tablet Take 1 tablet by mouth every 12 (twelve) hours for 10 days. 03/09/20 03/19/20 Yes Fondaw, Rodrigo Ran, PA  Multiple Vitamin (MULTIVITAMIN WITH MINERALS) TABS tablet Take 1 tablet by mouth daily.    [provider]  oxyCODONE (OXY IR/ROXICODONE) 5 MG immediate release tablet Take 1-2 tablets (5-10 mg total) by mouth every 6 (six) hours as needed for moderate pain or severe pain. 01/17/15   Elgergawy, Leana Roe, MD    Allergies    Patient has no known allergies.  Review of Systems   Review of Systems  Constitutional: Negative for chills and fever.  HENT: Positive for dental problem. Negative for congestion.   Respiratory: Negative for shortness of breath.   Cardiovascular: Negative for chest pain.  Gastrointestinal: Negative for abdominal pain.  Musculoskeletal: Negative for neck pain.    Physical Exam Updated Vital Signs BP 112/78 (BP Location: Left Arm)   Pulse 98   Temp 98.6 F (37 C) (Oral)   Resp 18   Ht 5\' 7"  (1.702 m)   Wt 83.9 kg   SpO2 99%   BMI 28.98 kg/m   Physical Exam Vitals and nursing note reviewed.  Constitutional:      General: He is not in acute distress.    Appearance: Normal appearance. He is not ill-appearing.  HENT:  Head: Normocephalic and atraumatic.     Mouth/Throat:      Comments: No sublingual tenderness palpation.  Full range of motion of tongue. Eyes:     General: No scleral icterus.       Right eye: No discharge.        Left eye: No discharge.     Conjunctiva/sclera: Conjunctivae normal.  Pulmonary:     Effort: Pulmonary effort is normal.     Breath sounds: No stridor.  Neurological:     Mental Status: He is alert and oriented to person, place, and time. Mental status is at baseline.     ED Results / Procedures / Treatments   Labs (all labs ordered are listed, but only abnormal results are displayed) Labs Reviewed - No data to display  EKG None  Radiology No results found.  Procedures Procedures  (including critical care time)  Medications Ordered in ED Medications - No data to display  ED Course  I have reviewed the triage vital signs and the nursing notes.  Pertinent labs & imaging results that were available during my care of the patient were reviewed by me and considered in my medical decision making (see chart for details).    MDM Rules/Calculators/A&P                          Patient with periapical abscesses 45 year old male has poor dentition and has 2 nearly fully rotten teeth.  He has seen a dentist in the past but not for approximately a year.  He states he will be able to follow-up with the dentist he has in the past however I did provide him also with Dr. Mayford Knife our on-call dentist information.  He states he will probably follow-up with dentistry provided Augmentin for periapical abscess.  Return precautions given.  He has no trismus, forage motion of tongue no sublingual tenderness to palpation I doubt Ludwick's, RPA, PTA or other deep space infection.  Return precautions were given.  Final Clinical Impression(s) / ED Diagnoses Final diagnoses:  Pain, dental  Periapical abscess    Rx / DC Orders ED Discharge Orders         Ordered    amoxicillin-clavulanate (AUGMENTIN) 875-125 MG tablet  Every 12 hours        03/09/20 2314           Gailen Shelter, [place] 03/09/20 2318    Vanetta Mulders, MD 03/11/20 [phone]

## 2020-03-09 NOTE — Discharge Instructions (Addendum)
Please take antibiotics for the full 10-day course I prescribed you.  Please follow-up with your dentist.  If you do not have a dentist please follow-up with the 1 I have provided you information for --Dr. Mayford Knife.  It is vitally important that you follow-up with a dentist to have these teeth evaluated they very well may need to be removed.

## 2022-08-14 ENCOUNTER — Other Ambulatory Visit: Payer: Self-pay

## 2022-08-14 ENCOUNTER — Emergency Department (HOSPITAL_BASED_OUTPATIENT_CLINIC_OR_DEPARTMENT_OTHER)
Admission: EM | Admit: 2022-08-14 | Discharge: 2022-08-15 | Disposition: A | Discharge status: Home / Self Care | Payer: BC Managed Care – PPO | Attending: Emergency Medicine | Admitting: Emergency Medicine

## 2022-08-14 ENCOUNTER — Encounter (HOSPITAL_BASED_OUTPATIENT_CLINIC_OR_DEPARTMENT_OTHER): Payer: Self-pay | Admitting: Emergency Medicine

## 2022-08-14 ENCOUNTER — Emergency Department (HOSPITAL_BASED_OUTPATIENT_CLINIC_OR_DEPARTMENT_OTHER): Payer: BC Managed Care – PPO

## 2022-08-14 DIAGNOSIS — F17210 Nicotine dependence, cigarettes, uncomplicated: Secondary | ICD-10-CM | POA: Diagnosis not present

## 2022-08-14 DIAGNOSIS — S8992XA Unspecified injury of left lower leg, initial encounter: Secondary | ICD-10-CM | POA: Insufficient documentation

## 2022-08-14 DIAGNOSIS — S6992XA Unspecified injury of left wrist, hand and finger(s), initial encounter: Secondary | ICD-10-CM | POA: Insufficient documentation

## 2022-08-14 DIAGNOSIS — W208XXA Other cause of strike by thrown, projected or falling object, initial encounter: Secondary | ICD-10-CM | POA: Insufficient documentation

## 2022-08-14 DIAGNOSIS — W19XXXA Unspecified fall, initial encounter: Secondary | ICD-10-CM

## 2022-08-14 DIAGNOSIS — Y990 Civilian activity done for income or pay: Secondary | ICD-10-CM | POA: Diagnosis not present

## 2022-08-14 MED ORDER — NAPROXEN 250 MG PO TABS
500.0000 mg | ORAL_TABLET | Freq: Once | ORAL | Status: AC
  Administered 2022-08-14: 500 mg via ORAL
  Filled 2022-08-14: qty 2

## 2022-08-14 MED ORDER — NAPROXEN 375 MG PO TABS: ORAL_TABLET | ORAL | 0 refills | Status: AC

## 2022-08-14 NOTE — ED Triage Notes (Addendum)
Left wrist pain, object fell on it at work a couple weeks ago. Then caught himself from a fall today due to knee giving out.  Would like knee checked out as well.

## 2022-08-14 NOTE — ED Provider Notes (Signed)
[facility] Provider Note: [name] Dell, MD, FACEP  CSN: [id number] MRN: [id number] ARRIVAL: 08/14/22 at 2254 ROOM: [facility]02/[facility]02   CHIEF COMPLAINT  Wrist Pain and Knee Pain   HISTORY OF PRESENT ILLNESS  08/14/22 11:36 PM [name] [name] is a 47 y.o. male who injured his left wrist when something fell on it at work about 3 weeks ago.  About 9:30 PM his left knee "gave out" (it has done this before) and he fell onto his outstretched left hand.  He is having pain in the left wrist at about the anatomic snuffbox.  He rates the pain as a 4 out of 10, worse with movement or palpation.  He is having negligible pain in the left knee.   Past Medical History:  Diagnosis Date   Shortness of breath dyspnea     Past Surgical History:  Procedure Laterality Date   FOREIGN BODY REMOVAL Right 01/14/2015   Procedure: REMOVAL FOREIGN BODY EXTREMITY;  Surgeon: Toni Arthurs, MD;  Location: [facility] OR;  Service: Orthopedics;  Laterality: Right;   I & D EXTREMITY Right 01/14/2015   Procedure: IRRIGATION AND DEBRIDEMENT EXTREMITY;  Surgeon: Toni Arthurs, MD;  Location: [facility] OR;  Service: Orthopedics;  Laterality: Right;    History reviewed. No pertinent family history.  Social History   Tobacco Use   Smoking status: Every Day    Packs/day: 1.50    Years: 25.00    Additional pack years: 0.00    Total pack years: 37.50    Types: Cigarettes   Smokeless tobacco: Never  Substance Use Topics   Alcohol use: Yes    Comment: occ   Drug use: Never    Types: Marijuana    Prior to Admission medications   Medication Sig Start Date End Date Taking? Authorizing Provider  naproxen (NAPROSYN) 375 MG tablet Take 1 tablet twice daily as needed for pain. 08/14/22  Yes Racheal Mathurin, MD    Allergies Patient has no known allergies.   REVIEW OF SYSTEMS  Negative except as noted here or in the History of Present Illness.   PHYSICAL EXAMINATION  Initial Vital Signs Blood pressure 123/77, pulse 95,  temperature 98.7 F (37.1 C), resp. rate 18, height 5\' 9"  (1.753 m), weight 70.8 kg, SpO2 98 %.  Examination General: Well-developed, well-nourished male in no acute distress; appearance consistent with age of record HENT: normocephalic; atraumatic Eyes: Normal appearance Neck: supple Heart: regular rate and rhythm Lungs: clear to auscultation bilaterally Abdomen: soft; nondistended; nontender; bowel sounds present Extremities: No deformity; mild pain on passive flexion and extension of left knee without swelling; tenderness of left anatomic snuffbox Neurologic: Awake, alert and oriented; motor function intact in all extremities and symmetric; no facial droop Skin: Warm and dry Psychiatric: Normal mood and affect   RESULTS  Summary of this visit's results, reviewed and interpreted by myself:   EKG Interpretation  Date/Time:    Ventricular Rate:    PR Interval:    QRS Duration:   QT Interval:    QTC Calculation:   R Axis:     Text Interpretation:         Laboratory Studies: No results found for this or any previous visit (from the past 24 hour(s)). Imaging Studies: DG Wrist Complete Left  Result Date: 08/14/2022 CLINICAL DATA:  Left wrist injury and pain fell on it at work couple of weeks ago. EXAM: LEFT WRIST - COMPLETE 3+ VIEW COMPARISON:  None Available. FINDINGS: There is no evidence of fracture or  dislocation. There is no evidence of arthropathy or other focal bone abnormality. Soft tissues are unremarkable. IMPRESSION: Negative. Electronically Signed   By: Larose Hires D.O.   On: 08/14/2022 23:31    ED COURSE and MDM  Nursing notes, initial and subsequent vitals signs, including pulse oximetry, reviewed and interpreted by myself.  Vitals:   08/14/22 2259 08/14/22 2303 08/14/22 2326  BP: 123/77    Pulse: 95    Resp: 18    Temp: 98.7 F (37.1 C)    SpO2: 97%  98%  Weight:  70.8 kg   Height:  5\' 9"  (1.753 m)    Medications  naproxen (NAPROSYN) tablet 500 mg  (has no administration in time range)    The patient's wrist radiographs are negative but he was advised that scaphoid fractures can often be occult and for this reason we will keep him in a thumb spica splint pending follow-up with orthopedic surgery in about a week.  We will place his left knee in a knee sleeve to provide support as well.  PROCEDURES  Procedures   ED DIAGNOSES     ICD-10-CM   1. Fall, initial encounter  W19.XXXA     2. Wrist injury, left, initial encounter  S69.92XA     3. Knee injury, left, initial encounter  S89.92XA          [name] [name], [name] Ruiz, MD 08/14/22 [phone]

## 2022-08-29 ENCOUNTER — Other Ambulatory Visit: Payer: Self-pay

## 2022-08-29 ENCOUNTER — Encounter (HOSPITAL_BASED_OUTPATIENT_CLINIC_OR_DEPARTMENT_OTHER): Payer: Self-pay | Admitting: Pediatrics

## 2022-08-29 ENCOUNTER — Emergency Department (HOSPITAL_BASED_OUTPATIENT_CLINIC_OR_DEPARTMENT_OTHER)
Admission: EM | Admit: 2022-08-29 | Discharge: 2022-08-29 | Disposition: A | Discharge status: Home / Self Care | Payer: BC Managed Care – PPO | Source: Home / Self Care | Attending: Emergency Medicine | Admitting: Emergency Medicine

## 2022-08-29 DIAGNOSIS — K047 Periapical abscess without sinus: Secondary | ICD-10-CM | POA: Insufficient documentation

## 2022-08-29 DIAGNOSIS — K0889 Other specified disorders of teeth and supporting structures: Secondary | ICD-10-CM | POA: Diagnosis present

## 2022-08-29 DIAGNOSIS — H9201 Otalgia, right ear: Secondary | ICD-10-CM | POA: Diagnosis not present

## 2022-08-29 MED ORDER — AMOXICILLIN-POT CLAVULANATE 875-125 MG PO TABS
1.0000 | ORAL_TABLET | Freq: Once | ORAL | Status: AC
  Administered 2022-08-29: 1 via ORAL
  Filled 2022-08-29: qty 1

## 2022-08-29 MED ORDER — IBUPROFEN 800 MG PO TABS
800.0000 mg | ORAL_TABLET | Freq: Once | ORAL | Status: AC
  Administered 2022-08-29: 800 mg via ORAL
  Filled 2022-08-29: qty 1

## 2022-08-29 MED ORDER — AMOXICILLIN-POT CLAVULANATE 875-125 MG PO TABS: 1.0000 | ORAL_TABLET | Freq: Two times a day (BID) | ORAL | 0 refills | Status: AC

## 2022-08-29 NOTE — ED Triage Notes (Signed)
C/O left sided mouth / dental pain, but noticed swelling higher up by his ear and felt some pop with drainage.

## 2022-08-29 NOTE — ED Notes (Signed)

## 2022-08-29 NOTE — Discharge Instructions (Addendum)
Evaluation today revealed that you likely have a dental infection.  I am starting you on Augmentin which is an antibiotic that will treat the infection.  You do need to follow-up with a dentist.  This is the website for a local dentist in your area:https: [email or web address], phone number [phone].  I have also attached contact information for another dentist in [place].  If you have worsening dental pain, oozing or discharge from your ear or your mouth, new fever, swelling or pain in your neck or any other concern please return emerged part for further evaluation.

## 2022-08-29 NOTE — ED Notes (Signed)
Cannot discharge, registration in chart.   

## 2022-08-29 NOTE — ED Provider Notes (Signed)
[place] [facility] Provider Note   CSN: [id number] Arrival date & time: 08/29/22  4098     History  Chief Complaint  Patient presents with   Dental Pain   Otalgia   HPI [name] [name] is a 47 y.o. male presenting for dental pain and otalgia.  Started 2 weeks ago.  Pain began in the right lower teeth and now radiating to the back of his right ear.  Denies tinnitus or hearing loss.  Denies discharge from the ear.  Stated that he felt a bump behind his ear which he "popped' and noted some pustular drainage.  That was about a week ago.  Not been taking anything for his pain.  States he does not have a dentist.  Denies neck pain or swelling.   Dental Pain Otalgia      Home Medications Prior to Admission medications   Medication Sig Start Date End Date Taking? Authorizing Provider  amoxicillin-clavulanate (AUGMENTIN) 875-125 MG tablet Take 1 tablet by mouth every 12 (twelve) hours. 08/29/22  Yes Gareth Eagle, PA-C  naproxen (NAPROSYN) 375 MG tablet Take 1 tablet twice daily as needed for pain. 08/14/22   Molpus, Jonny Ruiz, MD      Allergies    Patient has no known allergies.    Review of Systems   Review of Systems  HENT:  Positive for dental problem and ear pain.     Physical Exam Updated Vital Signs BP (!) 132/90 (BP Location: Left Arm)   Pulse 87   Temp 98.8 F (37.1 C) (Oral)   Resp 18   Ht 5\' 9"  (1.753 m)   Wt 70.8 kg   SpO2 100%   BMI 23.04 kg/m  Physical Exam HENT:     Right Ear: Tympanic membrane, ear canal and external ear normal. No drainage or swelling. No middle ear effusion. There is no impacted cerumen. No mastoid tenderness.     Left Ear: Tympanic membrane, ear canal and external ear normal.     Ears:     Comments: No masses, palpable fluctuance or lesion behind the right ear.    Mouth/Throat:     Tongue: No lesions. Tongue does not deviate from midline.     Pharynx: Oropharynx is clear. Uvula midline.       Comments: No sublingual swelling.    ED Results / Procedures / Treatments   Labs (all labs ordered are listed, but only abnormal results are displayed) Labs Reviewed - No data to display  EKG None  Radiology No results found.  Procedures Procedures    Medications Ordered in ED Medications  amoxicillin-clavulanate (AUGMENTIN) 875-125 MG per tablet 1 tablet (has no administration in time range)  ibuprofen (ADVIL) tablet 800 mg (has no administration in time range)    ED Course/ Medical Decision Making/ A&P                             Medical Decision Making  47 year old well-appearing male presenting for dental pain and otalgia.  Exam notable for gingival swelling about the right lower teeth.  Inspection of the ear was unremarkable.  DDx includes fracture, dental abscess, deep space abscess of the neck, Ludwig's angina, otitis media.  Symptoms most consistent with dental infection.  Started on Augmentin.  Advised to follow-up with dentist.  Discussed return precautions.  Vital stable discharge.        Final Clinical Impression(s) / ED Diagnoses Final  diagnoses:  Dental infection    Rx / DC Orders ED Discharge Orders          Ordered    amoxicillin-clavulanate (AUGMENTIN) 875-125 MG tablet  Every 12 hours        08/29/22 1048              Gareth Eagle, PA-C 08/29/22 1053    Terrilee Files, MD 08/29/22 1659
